# Patient Record
Sex: Female | Born: 1970 | Race: Black or African American | Hispanic: No | Marital: Single | Smoking: Never smoker
Health system: Southern US, Community
[De-identification: ages and names within clinical notes are randomized; demographics above are authoritative.]

## PROBLEM LIST (undated history)

## (undated) ENCOUNTER — Emergency Department (HOSPITAL_COMMUNITY): Payer: BLUE CROSS/BLUE SHIELD

## (undated) DIAGNOSIS — Z332 Encounter for elective termination of pregnancy: Secondary | ICD-10-CM

## (undated) DIAGNOSIS — R011 Cardiac murmur, unspecified: Secondary | ICD-10-CM

## (undated) DIAGNOSIS — D649 Anemia, unspecified: Secondary | ICD-10-CM

## (undated) DIAGNOSIS — Z9289 Personal history of other medical treatment: Secondary | ICD-10-CM

## (undated) HISTORY — PX: WISDOM TOOTH EXTRACTION: SHX21

## (undated) HISTORY — PX: TUBAL LIGATION: SHX77

---

## 1998-10-20 ENCOUNTER — Inpatient Hospital Stay (HOSPITAL_COMMUNITY): Admission: AD | Admit: 1998-10-20 | Discharge: 1998-10-20 | Payer: Self-pay | Admitting: Obstetrics

## 1998-10-30 ENCOUNTER — Other Ambulatory Visit: Admission: RE | Admit: 1998-10-30 | Discharge: 1998-10-30 | Payer: Self-pay | Admitting: Obstetrics

## 1998-10-30 ENCOUNTER — Ambulatory Visit (HOSPITAL_COMMUNITY): Admission: RE | Admit: 1998-10-30 | Discharge: 1998-10-30 | Payer: Self-pay | Admitting: Obstetrics

## 1998-11-14 ENCOUNTER — Inpatient Hospital Stay (HOSPITAL_COMMUNITY): Admission: AD | Admit: 1998-11-14 | Discharge: 1998-11-14 | Payer: Self-pay | Admitting: Obstetrics

## 1998-12-24 ENCOUNTER — Ambulatory Visit (HOSPITAL_COMMUNITY): Admission: RE | Admit: 1998-12-24 | Discharge: 1998-12-24 | Payer: Self-pay | Admitting: Obstetrics

## 1999-04-18 ENCOUNTER — Inpatient Hospital Stay (HOSPITAL_COMMUNITY): Admission: AD | Admit: 1999-04-18 | Discharge: 1999-04-21 | Payer: Self-pay | Admitting: Obstetrics

## 1999-05-09 ENCOUNTER — Inpatient Hospital Stay (HOSPITAL_COMMUNITY): Admission: AD | Admit: 1999-05-09 | Discharge: 1999-05-09 | Payer: Self-pay | Admitting: Obstetrics

## 1999-05-22 ENCOUNTER — Inpatient Hospital Stay (HOSPITAL_COMMUNITY): Admission: AD | Admit: 1999-05-22 | Discharge: 1999-05-24 | Payer: Self-pay | Admitting: Obstetrics

## 1999-05-25 ENCOUNTER — Encounter (HOSPITAL_COMMUNITY): Admission: RE | Admit: 1999-05-25 | Discharge: 1999-08-23 | Payer: Self-pay | Admitting: Obstetrics

## 1999-07-10 ENCOUNTER — Ambulatory Visit (HOSPITAL_COMMUNITY): Admission: RE | Admit: 1999-07-10 | Discharge: 1999-07-10 | Payer: Self-pay | Admitting: Obstetrics

## 2001-01-10 ENCOUNTER — Inpatient Hospital Stay (HOSPITAL_COMMUNITY): Admission: AD | Admit: 2001-01-10 | Discharge: 2001-01-10 | Payer: Self-pay | Admitting: Obstetrics

## 2002-07-14 ENCOUNTER — Encounter: Admission: RE | Admit: 2002-07-14 | Discharge: 2002-07-14 | Payer: Self-pay | Admitting: *Deleted

## 2004-06-27 ENCOUNTER — Emergency Department (HOSPITAL_COMMUNITY): Admission: EM | Admit: 2004-06-27 | Discharge: 2004-06-28 | Payer: Self-pay | Admitting: Emergency Medicine

## 2004-07-04 ENCOUNTER — Emergency Department (HOSPITAL_COMMUNITY): Admission: EM | Admit: 2004-07-04 | Discharge: 2004-07-04 | Payer: Self-pay | Admitting: Family Medicine

## 2004-09-04 ENCOUNTER — Ambulatory Visit: Payer: Self-pay | Admitting: Obstetrics and Gynecology

## 2006-08-21 ENCOUNTER — Emergency Department (HOSPITAL_COMMUNITY): Admission: EM | Admit: 2006-08-21 | Discharge: 2006-08-21 | Payer: Self-pay | Admitting: Family Medicine

## 2007-01-27 ENCOUNTER — Emergency Department (HOSPITAL_COMMUNITY): Admission: EM | Admit: 2007-01-27 | Discharge: 2007-01-27 | Payer: Self-pay | Admitting: Family Medicine

## 2007-04-25 ENCOUNTER — Other Ambulatory Visit: Admission: RE | Admit: 2007-04-25 | Discharge: 2007-04-25 | Payer: Self-pay | Admitting: Obstetrics and Gynecology

## 2008-12-22 ENCOUNTER — Emergency Department (HOSPITAL_COMMUNITY): Admission: EM | Admit: 2008-12-22 | Discharge: 2008-12-22 | Payer: Self-pay | Admitting: Emergency Medicine

## 2009-05-13 ENCOUNTER — Ambulatory Visit (HOSPITAL_COMMUNITY): Admission: RE | Admit: 2009-05-13 | Discharge: 2009-05-13 | Payer: Self-pay | Admitting: Obstetrics & Gynecology

## 2009-08-06 ENCOUNTER — Emergency Department (HOSPITAL_COMMUNITY): Admission: EM | Admit: 2009-08-06 | Discharge: 2009-08-06 | Payer: Self-pay | Admitting: Emergency Medicine

## 2009-08-09 ENCOUNTER — Emergency Department (HOSPITAL_COMMUNITY): Admission: EM | Admit: 2009-08-09 | Discharge: 2009-08-09 | Payer: Self-pay | Admitting: Emergency Medicine

## 2010-11-08 ENCOUNTER — Encounter: Payer: Self-pay | Admitting: *Deleted

## 2011-08-07 ENCOUNTER — Inpatient Hospital Stay (INDEPENDENT_AMBULATORY_CARE_PROVIDER_SITE_OTHER)
Admission: RE | Admit: 2011-08-07 | Discharge: 2011-08-07 | Disposition: A | Discharge status: Home / Self Care | Payer: Worker's Compensation | Source: Ambulatory Visit | Attending: Emergency Medicine | Admitting: Emergency Medicine

## 2011-08-07 DIAGNOSIS — S335XXA Sprain of ligaments of lumbar spine, initial encounter: Secondary | ICD-10-CM

## 2012-11-05 ENCOUNTER — Emergency Department (HOSPITAL_BASED_OUTPATIENT_CLINIC_OR_DEPARTMENT_OTHER): Payer: Self-pay

## 2012-11-05 ENCOUNTER — Emergency Department (HOSPITAL_BASED_OUTPATIENT_CLINIC_OR_DEPARTMENT_OTHER)
Admission: EM | Admit: 2012-11-05 | Discharge: 2012-11-05 | Disposition: A | Discharge status: Home / Self Care | Payer: Self-pay | Attending: Emergency Medicine | Admitting: Emergency Medicine

## 2012-11-05 ENCOUNTER — Encounter (HOSPITAL_BASED_OUTPATIENT_CLINIC_OR_DEPARTMENT_OTHER): Payer: Self-pay | Admitting: *Deleted

## 2012-11-05 DIAGNOSIS — R071 Chest pain on breathing: Secondary | ICD-10-CM | POA: Insufficient documentation

## 2012-11-05 DIAGNOSIS — R0789 Other chest pain: Secondary | ICD-10-CM

## 2012-11-05 DIAGNOSIS — R0602 Shortness of breath: Secondary | ICD-10-CM | POA: Insufficient documentation

## 2012-11-05 HISTORY — DX: Cardiac murmur, unspecified: R01.1

## 2012-11-05 MED ORDER — GI COCKTAIL ~~LOC~~
30.0000 mL | Freq: Once | ORAL | Status: AC
  Administered 2012-11-05: 30 mL via ORAL
  Filled 2012-11-05: qty 30

## 2012-11-05 MED ORDER — OMEPRAZOLE 20 MG PO CPDR: DELAYED_RELEASE_CAPSULE | ORAL | Status: DC

## 2012-11-05 MED ORDER — FAMOTIDINE 20 MG PO TABS: 20.0000 mg | ORAL_TABLET | Freq: Two times a day (BID) | ORAL | Status: DC

## 2012-11-05 MED ORDER — IBUPROFEN 600 MG PO TABS: 600.0000 mg | ORAL_TABLET | Freq: Four times a day (QID) | ORAL | Status: DC | PRN

## 2012-11-05 NOTE — ED Notes (Addendum)
Pt describes midsternal CP off and on x 2 weeks. "Indigestion" "Today not going away" Denies other s/s. EKG done at triage. Charge RN made aware.

## 2012-11-05 NOTE — ED Provider Notes (Signed)
History     CSN: [id number]  Arrival date & time 11/05/12  1440   First MD Initiated Contact with Patient 11/05/12 1517      Chief Complaint  Patient presents with  . Chest Pain    (Consider location/radiation/quality/duration/timing/severity/associated sxs/prior treatment) HPI Comments: Patient presents with a 1 week history of chest pain.  She states she had an uncomfortable sensation in her chest about 1 week ago.  She also developed a "stomach virus" which began shortly after her chest pain began.  She describes the pain as a burning pain directly under her sternum without radiation.  She states the pain is constant and is not worsened by eating or by certain types of foods.  No history of heart disease or indigestion.  Patient has tried TUMS for the pain which did not help. Currently, patient is not experiencing any N/V/D or fevers. Onset gradual, nothing has relieved symptoms. No risk factors for pulmonary embolism including recent travel or immobilizations, recent surgery, history of blood clots, estrogen use.  Patient is a 42 y.o. female presenting with chest pain. The history is provided by the patient.  Chest Pain Primary symptoms include shortness of breath (mild, with worse pain). Pertinent negatives for primary symptoms include no fever, no cough, no palpitations, no abdominal pain, no nausea and no vomiting.  Pertinent negatives for associated symptoms include no diaphoresis.     Past Medical History  Diagnosis Date  . Heart murmur     History reviewed. No pertinent past surgical history.  History reviewed. No pertinent family history.  History  Substance Use Topics  . Smoking status: Never Smoker   . Smokeless tobacco: Not on file  . Alcohol Use: No    OB History    Grav Para Term Preterm Abortions TAB SAB Ect Mult Living                  Review of Systems  Constitutional: Negative for fever and diaphoresis.  HENT: Negative for neck pain.   Eyes:  Negative for redness.  Respiratory: Positive for shortness of breath (mild, with worse pain). Negative for cough.   Cardiovascular: Positive for chest pain. Negative for palpitations and leg swelling.  Gastrointestinal: Negative for nausea, vomiting and abdominal pain.  Genitourinary: Negative for dysuria.  Musculoskeletal: Negative for back pain.  Skin: Negative for rash.  Neurological: Negative for syncope and light-headedness.    Allergies  Review of patient's allergies indicates no known allergies.  Home Medications  No current outpatient prescriptions on file.  BP 115/75  Pulse 96  Temp 97.6 F (36.4 C) (Oral)  Resp 20  Ht 5' (1.524 m)  Wt 150 lb (68.04 kg)  BMI 29.30 kg/m2  SpO2 99%  LMP 10/29/2012  Physical Exam  Nursing note and vitals reviewed. Constitutional: She appears well-developed and well-nourished.  HENT:  Head: Normocephalic and atraumatic.  Mouth/Throat: Mucous membranes are normal. Mucous membranes are not dry.  Eyes: Conjunctivae normal are normal.  Neck: Trachea normal and normal range of motion. Neck supple. Normal carotid pulses and no JVD present. No muscular tenderness present. Carotid bruit is not present. No tracheal deviation present.  Cardiovascular: Normal rate, regular rhythm, S1 normal, S2 normal, normal heart sounds and intact distal pulses.  Exam reveals no decreased pulses.   No murmur heard. Pulmonary/Chest: Effort normal and breath sounds normal. No respiratory distress. She has no wheezes. She exhibits tenderness.    Abdominal: Soft. Normal aorta and bowel sounds are normal. There is  no tenderness. There is no rebound and no guarding.  Musculoskeletal: Normal range of motion.  Neurological: She is alert.  Skin: Skin is warm and dry. She is not diaphoretic. No cyanosis. No pallor.  Psychiatric: She has a normal mood and affect.    ED Course  Procedures (including critical care time)  Labs Reviewed - No data to display Dg Chest 2  View  11/05/2012  *RADIOLOGY REPORT*  Clinical Data: Short of breath and chest pain  CHEST - 2 VIEW  Comparison: 08/09/2009  Findings: Heart size is normal.  Negative for heart failure. Negative for pneumonia or effusion.  Lungs are clear.  IMPRESSION: No acute abnormality.   Original Report Authenticated By: Janeece Riggers, M.D.      1. Chest wall pain     Patient seen and examined. Work-up initiated. GI cocktail ordered.   Vital signs reviewed and are as follows: Filed Vitals:   11/05/12 1447  BP: 115/75  Pulse: 96  Temp: 97.6 F (36.4 C)  Resp: 20   4:58 PM patient notes improvement of chest pain from 5/10-1/10 after GI cocktail.  Patient informed of x-ray results.  Patient was counseled to return with severe chest pain, especially if the pain is crushing or pressure-like and spreads to the arms, back, neck, or jaw, or if they have sweating, nausea, or shortness of breath with the pain. They were encouraged to call 911 with these symptoms.   They were also told to return if their chest pain gets worse and does not go away with rest, they have an attack of chest pain lasting longer than usual despite rest and treatment with the medications their caregiver has prescribed, if they wake from sleep with chest pain or shortness of breath, if they feel dizzy or faint, if they have chest pain not typical of their usual pain, or if they have any other emergent concerns regarding their health.  The patient verbalized understanding and agreed.   Abdominal exam unchanged. Soft and nontender.   Date: 11/05/2012  Rate: 76  Rhythm: normal sinus rhythm  QRS Axis: normal  Intervals: normal  ST/T Wave abnormalities: normal  Conduction Disutrbances:none  Narrative Interpretation:   Old EKG Reviewed: none available   MDM  Patient with reproducible chest pain to palpation, most suspicious for costochondritis. Chest x-ray was negative. EKG is normal. Patient did get relief with GI cocktail so  possible GERD component. No abdominal tenderness on exam. Do not suspect pulmonary embolism or coronary artery disease. Will treat her symptoms conservatively.         Renne Crigler, [place] 11/05/12 1710

## 2012-11-05 NOTE — ED Provider Notes (Signed)
Medical screening examination/treatment/procedure(s) were performed by non-physician practitioner and as supervising physician I was immediately available for consultation/collaboration.   Celene Kras, MD 11/05/12 [phone]

## 2014-04-17 ENCOUNTER — Encounter (HOSPITAL_COMMUNITY): Payer: Self-pay | Admitting: Emergency Medicine

## 2014-04-17 ENCOUNTER — Emergency Department (HOSPITAL_COMMUNITY)
Admission: EM | Admit: 2014-04-17 | Discharge: 2014-04-17 | Disposition: A | Discharge status: Home / Self Care | Payer: BC Managed Care – PPO | Attending: Emergency Medicine | Admitting: Emergency Medicine

## 2014-04-17 DIAGNOSIS — Z79899 Other long term (current) drug therapy: Secondary | ICD-10-CM | POA: Insufficient documentation

## 2014-04-17 DIAGNOSIS — N390 Urinary tract infection, site not specified: Secondary | ICD-10-CM

## 2014-04-17 DIAGNOSIS — R011 Cardiac murmur, unspecified: Secondary | ICD-10-CM | POA: Insufficient documentation

## 2014-04-17 LAB — URINALYSIS, ROUTINE W REFLEX MICROSCOPIC
BILIRUBIN URINE: NEGATIVE
Glucose, UA: NEGATIVE mg/dL
Ketones, ur: NEGATIVE mg/dL
NITRITE: NEGATIVE
Specific Gravity, Urine: 1.023 (ref 1.005–1.030)
UROBILINOGEN UA: 0.2 mg/dL (ref 0.0–1.0)
pH: 6 (ref 5.0–8.0)

## 2014-04-17 LAB — URINE MICROSCOPIC-ADD ON

## 2014-04-17 MED ORDER — NAPROXEN 250 MG PO TABS
250.0000 mg | ORAL_TABLET | Freq: Once | ORAL | Status: AC
  Administered 2014-04-17: 250 mg via ORAL
  Filled 2014-04-17: qty 1

## 2014-04-17 MED ORDER — PHENAZOPYRIDINE HCL 100 MG PO TABS
200.0000 mg | ORAL_TABLET | Freq: Once | ORAL | Status: AC
  Administered 2014-04-17: 200 mg via ORAL
  Filled 2014-04-17: qty 2

## 2014-04-17 MED ORDER — CEPHALEXIN 500 MG PO CAPS: 500.0000 mg | ORAL_CAPSULE | Freq: Two times a day (BID) | ORAL | Status: DC

## 2014-04-17 MED ORDER — PHENAZOPYRIDINE HCL 200 MG PO TABS: 200.0000 mg | ORAL_TABLET | Freq: Three times a day (TID) | ORAL | Status: DC | PRN

## 2014-04-17 MED ORDER — CEPHALEXIN 250 MG PO CAPS
500.0000 mg | ORAL_CAPSULE | Freq: Once | ORAL | Status: AC
  Administered 2014-04-17: 500 mg via ORAL
  Filled 2014-04-17: qty 2

## 2014-04-17 NOTE — Discharge Instructions (Signed)
Do not hesitate to return to the Emergency Department for any new, worsening or concerning symptoms.   If you do not have a primary care doctor you can establish one at the   [facility]: [place] [phone]  After you establish care. Let them know you were seen in the emergency room. They must obtain records for further management.    Urinary Tract Infection Urinary tract infections (UTIs) can develop anywhere along your urinary tract. Your urinary tract is your body's drainage system for removing wastes and extra water. Your urinary tract includes two kidneys, two ureters, a bladder, and a urethra. Your kidneys are a pair of bean-shaped organs. Each kidney is about the size of your fist. They are located below your ribs, one on each side of your spine. CAUSES Infections are caused by microbes, which are microscopic organisms, including fungi, viruses, and bacteria. These organisms are so small that they can only be seen through a microscope. Bacteria are the microbes that most commonly cause UTIs. SYMPTOMS  Symptoms of UTIs may vary by age and gender of the patient and by the location of the infection. Symptoms in young women typically include a frequent and intense urge to urinate and a painful, burning feeling in the bladder or urethra during urination. Older women and men are more likely to be tired, shaky, and weak and have muscle aches and abdominal pain. A fever may mean the infection is in your kidneys. Other symptoms of a kidney infection include pain in your back or sides below the ribs, nausea, and vomiting. DIAGNOSIS To diagnose a UTI, your caregiver will ask you about your symptoms. Your caregiver also will ask to provide a urine sample. The urine sample will be tested for bacteria and white blood cells. White blood cells are made by your body to help fight infection. TREATMENT  Typically, UTIs can be treated with medication. Because  most UTIs are caused by a bacterial infection, they usually can be treated with the use of antibiotics. The choice of antibiotic and length of treatment depend on your symptoms and the type of bacteria causing your infection. HOME CARE INSTRUCTIONS  If you were prescribed antibiotics, take them exactly as your caregiver instructs you. Finish the medication even if you feel better after you have only taken some of the medication.  Drink enough water and fluids to keep your urine clear or pale yellow.  Avoid caffeine, tea, and carbonated beverages. They tend to irritate your bladder.  Empty your bladder often. Avoid holding urine for long periods of time.  Empty your bladder before and after sexual intercourse.  After a bowel movement, women should cleanse from front to back. Use each tissue only once. SEEK MEDICAL CARE IF:   You have back pain.  You develop a fever.  Your symptoms do not begin to resolve within 3 days. SEEK IMMEDIATE MEDICAL CARE IF:   You have severe back pain or lower abdominal pain.  You develop chills.  You have nausea or vomiting.  You have continued burning or discomfort with urination. MAKE SURE YOU:   Understand these instructions.  Will watch your condition.  Will get help right away if you are not doing well or get worse. Document Released: 07/15/2005 Document Revised: 04/05/2012 Document Reviewed: 11/13/2011 [facility] Patient Information 2015 [place], [place]. This information is not intended to replace advice given to you by your health care provider. Make sure you discuss any questions you have with  your health care provider.

## 2014-04-17 NOTE — ED Provider Notes (Signed)
CSN: [id number]     Arrival date & time 04/17/14  2101 History  This chart was scribed for Monico Blitz, PA-C working with Tanna Furry, MD by Randa Evens, ED Scribe. This patient was seen in room TR08C/TR08C and the patient's care was started at 9:11 PM.  Chief Complaint  Patient presents with  . Dysuria   Patient is a 43 y.o. female presenting with dysuria. The history is provided by the patient. No language interpreter was used.  Dysuria Associated symptoms: no fever, no nausea, no vaginal discharge and no vomiting    HPI Comments: Linda Baldwin is a 43 y.o. female who presents to the Emergency Department complaining of dysuria onset 3 days prior. She states she thinks are due to an UTI. She denies vaginal discharge, flank pain, fever,chills, nausea, or vomiting.    Past Medical History  Diagnosis Date  . Heart murmur    History reviewed. No pertinent past surgical history. History reviewed. No pertinent family history. History  Substance Use Topics  . Smoking status: Never Smoker   . Smokeless tobacco: Not on file  . Alcohol Use: No   OB History   Grav Para Term Preterm Abortions TAB SAB Ect Mult Living                 Review of Systems  Constitutional: Negative for fever and chills.  Gastrointestinal: Negative for nausea and vomiting.  Genitourinary: Positive for dysuria. Negative for vaginal discharge.   A complete 10 system review of systems was obtained and all systems are negative except as noted in the HPI and PMH.   Allergies  Review of patient's allergies indicates no known allergies.  Home Medications   Prior to Admission medications   Medication Sig Start Date End Date Taking? Authorizing Provider  Calcium Carbonate-Vitamin D (CALTRATE 600+D PO) Take 1 tablet by mouth daily.   Yes Historical Provider, MD  Cyanocobalamin (B-12 PO) Take 1 tablet by mouth daily. Unknown strength-otc   Yes Historical Provider, MD  ferrous sulfate (IRON SUPPLEMENT) 325  (65 FE) MG tablet Take 325 mg by mouth daily.   Yes Historical Provider, MD  Multiple Vitamin (MULTIVITAMIN) tablet Take 1 tablet by mouth daily.   Yes Historical Provider, MD  cephALEXin (KEFLEX) 500 MG capsule Take 1 capsule (500 mg total) by mouth 2 (two) times daily. 04/17/14   Nicole Pisciotta, PA-C  phenazopyridine (PYRIDIUM) 200 MG tablet Take 1 tablet (200 mg total) by mouth 3 (three) times daily as needed for pain. 04/17/14   Nicole Pisciotta, PA-C   Triage Vitals: BP 111/75  Pulse 84  Temp(Src) 98.7 F (37.1 C) (Oral)  Resp 18  SpO2 99%  Physical Exam  Nursing note and vitals reviewed. Constitutional: She is oriented to person, place, and time. She appears well-developed and well-nourished. No distress.  HENT:  Head: Normocephalic and atraumatic.  Eyes: Conjunctivae and EOM are normal.  Neck: Neck supple. No tracheal deviation present.  Cardiovascular: Normal rate, regular rhythm and intact distal pulses.   Pulmonary/Chest: Effort normal and breath sounds normal. No respiratory distress.  Abdominal: Soft. Bowel sounds are normal. She exhibits no distension. There is no tenderness. There is no rebound and no guarding.  Genitourinary:  No CVA tenderness to palpation bilaterally  Musculoskeletal: Normal range of motion.  Neurological: She is alert and oriented to person, place, and time.  Skin: Skin is warm and dry.  Psychiatric: She has a normal mood and affect. Her behavior is normal.  ED Course  Procedures (including critical care time) DIAGNOSTIC STUDIES: Oxygen Saturation is 99% on RA, normal by my interpretation.    COORDINATION OF CARE:    Labs Review Labs Reviewed  URINALYSIS, ROUTINE W REFLEX MICROSCOPIC - Abnormal; Notable for the following:    APPearance CLOUDY (*)    Hgb urine dipstick TRACE (*)    Protein, ur >300 (*)    Leukocytes, UA MODERATE (*)    All other components within normal limits  URINE MICROSCOPIC-ADD ON - Abnormal; Notable for the  following:    Squamous Epithelial / LPF FEW (*)    Bacteria, UA MANY (*)    All other components within normal limits  URINE CULTURE    Imaging Review No results found.   EKG Interpretation None      MDM   Final diagnoses:  UTI (lower urinary tract infection)   Filed Vitals:   04/17/14 2107 04/17/14 2257  BP: 111/75 127/87  Pulse: 84 74  Temp: 98.7 F (37.1 C) 97.4 F (36.3 C)  TempSrc: Oral Oral  Resp: 18 18  SpO2: 99% 100%    Medications  naproxen (NAPROSYN) tablet 250 mg (250 mg Oral Given 04/17/14 2203)  cephALEXin (KEFLEX) capsule 500 mg (500 mg Oral Given 04/17/14 2248)  phenazopyridine (PYRIDIUM) tablet 200 mg (200 mg Oral Given 04/17/14 2247)    Linda Baldwin is a 43 y.o. female presenting with urinary tract infection. No systemic signs of infection, no CVA tenderness palpation bilaterally chart review does not show any prior urine culture. Empirically treated with Keflex.  Evaluation does not show pathology that would require ongoing emergent intervention or inpatient treatment. Pt is hemodynamically stable and mentating appropriately. Discussed findings and plan with patient/guardian, who agrees with care plan. All questions answered. Return precautions discussed and outpatient follow up given.   Discharge Medication List as of 04/17/2014 10:48 PM    START taking these medications   Details  cephALEXin (KEFLEX) 500 MG capsule Take 1 capsule (500 mg total) by mouth 2 (two) times daily., Starting 04/17/2014, Until Discontinued, Print    phenazopyridine (PYRIDIUM) 200 MG tablet Take 1 tablet (200 mg total) by mouth 3 (three) times daily as needed for pain., Starting 04/17/2014, Until Discontinued, Print              I personally performed the services described in this documentation, which was scribed in my presence. The recorded information has been reviewed and is accurate.     Monico Blitz, PA-C 04/18/14 [phone]

## 2014-04-17 NOTE — ED Notes (Signed)
Pt informed, urine need for UA.

## 2014-04-17 NOTE — ED Notes (Signed)
Present with dysuria began 3 days ago. Denies vaginal discharge and blood in urine.

## 2014-04-19 LAB — URINE CULTURE

## 2014-04-20 ENCOUNTER — Telehealth (HOSPITAL_COMMUNITY): Payer: Self-pay

## 2014-04-20 NOTE — ED Notes (Signed)
Post ED Visit - Positive Culture Follow-up: Successful Patient Follow-Up  Culture assessed and recommendations reviewed by: []  Wes [place], Pharm.D., BCPS []  Heide Guile, Pharm.D., BCPS []  Alycia Rossetti, Pharm.D., BCPS [x]  [place], Pharm.D., BCPS, AAHIVP []  Legrand Como, Pharm.D., BCPS, AAHIVP  Positive urine culture  []  Patient discharged without antimicrobial prescription and treatment is now indicated [x]  Organism is resistant to prescribed ED discharge antimicrobial []  Patient with positive blood cultures  Changes discussed with ED provider: Clayton Bibles PA New antibiotic prescription  Stop Keflex and start Septra DS take 1 bid x 3 days Called to unknown at this time  Contacted patient, date 04/20/14, time 1007   Marye Round C 04/20/2014, 10:06 AM

## 2014-04-20 NOTE — Progress Notes (Signed)
ED Antimicrobial Stewardship Positive Culture Follow Up   Linda Baldwin is an 43 y.o. female who presented to [facility] on 04/17/2014 with a chief complaint of  Chief Complaint  Patient presents with  . Dysuria    Recent Results (from the past 720 hour(s))  URINE CULTURE     Status: None   Collection Time    04/17/14  9:39 PM      Result Value Ref Range Status   Specimen Description URINE, CLEAN CATCH   Final   Special Requests NONE   Final   Culture  Setup Time     Final   Value: 04/17/2014 23:05     Performed at [place]     Final   Value: >=100,000 COLONIES/ML     Performed at [facility]   Culture     Final   Value: STAPHYLOCOCCUS SPECIES (COAGULASE NEGATIVE)     Note: RIFAMPIN AND GENTAMICIN SHOULD NOT BE USED AS SINGLE DRUGS FOR TREATMENT OF STAPH INFECTIONS.     Performed at [facility]   Report Status 04/19/2014 FINAL   Final   Organism ID, Bacteria STAPHYLOCOCCUS SPECIES (COAGULASE NEGATIVE)   Final    [x]  Treated with keflex, organism resistant to prescribed antimicrobial 43 yo who came in with 3 days of dysuria. She was dc home on keflex for UTI. Culture now back with coag neg staph that is resistant to beta-lactams. We'll treat with septra.  New antibiotic prescription:   Septra DS 1 PO BID x3 days  ED Provider: Clayton Bibles, PA   Onnie Boer, PharmD Pager: [phone] Infectious Diseases Pharmacist Phone# [phone]

## 2014-04-24 NOTE — ED Provider Notes (Signed)
Medical screening examination/treatment/procedure(s) were performed by non-physician practitioner and as supervising physician I was immediately available for consultation/collaboration.   EKG Interpretation None        Tanna Furry, MD 04/24/14 [phone]

## 2018-01-16 ENCOUNTER — Inpatient Hospital Stay (HOSPITAL_COMMUNITY): Payer: BLUE CROSS/BLUE SHIELD

## 2018-01-16 ENCOUNTER — Inpatient Hospital Stay (HOSPITAL_BASED_OUTPATIENT_CLINIC_OR_DEPARTMENT_OTHER)
Admission: EM | Admit: 2018-01-16 | Discharge: 2018-01-19 | DRG: 761 | Disposition: A | Discharge status: Home / Self Care | Payer: BLUE CROSS/BLUE SHIELD | Attending: Internal Medicine | Admitting: Internal Medicine

## 2018-01-16 ENCOUNTER — Encounter (HOSPITAL_BASED_OUTPATIENT_CLINIC_OR_DEPARTMENT_OTHER): Payer: Self-pay | Admitting: Emergency Medicine

## 2018-01-16 ENCOUNTER — Other Ambulatory Visit: Payer: Self-pay

## 2018-01-16 ENCOUNTER — Emergency Department (HOSPITAL_BASED_OUTPATIENT_CLINIC_OR_DEPARTMENT_OTHER): Payer: BLUE CROSS/BLUE SHIELD

## 2018-01-16 DIAGNOSIS — Z79899 Other long term (current) drug therapy: Secondary | ICD-10-CM

## 2018-01-16 DIAGNOSIS — Z792 Long term (current) use of antibiotics: Secondary | ICD-10-CM

## 2018-01-16 DIAGNOSIS — N920 Excessive and frequent menstruation with regular cycle: Secondary | ICD-10-CM | POA: Diagnosis present

## 2018-01-16 DIAGNOSIS — D649 Anemia, unspecified: Secondary | ICD-10-CM | POA: Diagnosis present

## 2018-01-16 DIAGNOSIS — D500 Iron deficiency anemia secondary to blood loss (chronic): Secondary | ICD-10-CM | POA: Diagnosis present

## 2018-01-16 DIAGNOSIS — K210 Gastro-esophageal reflux disease with esophagitis: Secondary | ICD-10-CM | POA: Diagnosis present

## 2018-01-16 DIAGNOSIS — R112 Nausea with vomiting, unspecified: Secondary | ICD-10-CM | POA: Diagnosis present

## 2018-01-16 DIAGNOSIS — E860 Dehydration: Secondary | ICD-10-CM | POA: Diagnosis present

## 2018-01-16 DIAGNOSIS — R509 Fever, unspecified: Secondary | ICD-10-CM | POA: Diagnosis present

## 2018-01-16 DIAGNOSIS — E669 Obesity, unspecified: Secondary | ICD-10-CM | POA: Diagnosis present

## 2018-01-16 DIAGNOSIS — R58 Hemorrhage, not elsewhere classified: Secondary | ICD-10-CM | POA: Diagnosis not present

## 2018-01-16 DIAGNOSIS — R197 Diarrhea, unspecified: Secondary | ICD-10-CM | POA: Diagnosis present

## 2018-01-16 DIAGNOSIS — D72829 Elevated white blood cell count, unspecified: Secondary | ICD-10-CM | POA: Diagnosis present

## 2018-01-16 DIAGNOSIS — E876 Hypokalemia: Secondary | ICD-10-CM | POA: Diagnosis present

## 2018-01-16 DIAGNOSIS — Z6831 Body mass index (BMI) 31.0-31.9, adult: Secondary | ICD-10-CM

## 2018-01-16 DIAGNOSIS — R5081 Fever presenting with conditions classified elsewhere: Secondary | ICD-10-CM | POA: Diagnosis not present

## 2018-01-16 DIAGNOSIS — D259 Leiomyoma of uterus, unspecified: Principal | ICD-10-CM | POA: Diagnosis present

## 2018-01-16 LAB — INFLUENZA PANEL BY PCR (TYPE A & B)
Influenza A By PCR: NEGATIVE
Influenza B By PCR: NEGATIVE

## 2018-01-16 LAB — CBC
HEMATOCRIT: 18.7 % — AB (ref 36.0–46.0)
Hemoglobin: 5.6 g/dL — CL (ref 12.0–15.0)
MCH: 20 pg — ABNORMAL LOW (ref 26.0–34.0)
MCHC: 29.9 g/dL — ABNORMAL LOW (ref 30.0–36.0)
MCV: 66.8 fL — ABNORMAL LOW (ref 78.0–100.0)
Platelets: 604 10*3/uL — ABNORMAL HIGH (ref 150–400)
RBC: 2.8 MIL/uL — AB (ref 3.87–5.11)
RDW: 28 % — AB (ref 11.5–15.5)
WBC: 10.6 10*3/uL — AB (ref 4.0–10.5)

## 2018-01-16 LAB — COMPREHENSIVE METABOLIC PANEL
ALT: 8 U/L — ABNORMAL LOW (ref 14–54)
ANION GAP: 11 (ref 5–15)
AST: 20 U/L (ref 15–41)
Albumin: 4.1 g/dL (ref 3.5–5.0)
Alkaline Phosphatase: 50 U/L (ref 38–126)
BILIRUBIN TOTAL: 0.8 mg/dL (ref 0.3–1.2)
BUN: 11 mg/dL (ref 6–20)
CO2: 21 mmol/L — ABNORMAL LOW (ref 22–32)
Calcium: 8.3 mg/dL — ABNORMAL LOW (ref 8.9–10.3)
Chloride: 105 mmol/L (ref 101–111)
Creatinine, Ser: 0.62 mg/dL (ref 0.44–1.00)
GFR calc non Af Amer: >60 mL/min (ref >60)
Glucose, Bld: 112 mg/dL — ABNORMAL HIGH (ref 65–99)
POTASSIUM: 3.3 mmol/L — AB (ref 3.5–5.1)
Sodium: 137 mmol/L (ref 135–145)
TOTAL PROTEIN: 7.7 g/dL (ref 6.5–8.1)

## 2018-01-16 LAB — URINALYSIS, ROUTINE W REFLEX MICROSCOPIC
Bilirubin Urine: NEGATIVE
Glucose, UA: NEGATIVE mg/dL
Hgb urine dipstick: NEGATIVE
KETONES UR: NEGATIVE mg/dL
LEUKOCYTES UA: NEGATIVE
NITRITE: NEGATIVE
PH: 5.5 (ref 5.0–8.0)
Protein, ur: NEGATIVE mg/dL

## 2018-01-16 LAB — PREPARE RBC (CROSSMATCH)

## 2018-01-16 LAB — FERRITIN: FERRITIN: 3 ng/mL — AB (ref 11–307)

## 2018-01-16 LAB — PREGNANCY, URINE: Preg Test, Ur: NEGATIVE

## 2018-01-16 LAB — VITAMIN B12: Vitamin B-12: 310 pg/mL (ref 180–914)

## 2018-01-16 LAB — HEMOGLOBIN AND HEMATOCRIT, BLOOD
HEMATOCRIT: 18.2 % — AB (ref 36.0–46.0)
HEMOGLOBIN: 5.4 g/dL — AB (ref 12.0–15.0)

## 2018-01-16 LAB — LIPASE, BLOOD: Lipase: 26 U/L (ref 11–51)

## 2018-01-16 LAB — RETICULOCYTES
RBC.: 2.59 MIL/uL — ABNORMAL LOW (ref 3.87–5.11)
RETIC CT PCT: 1.5 % (ref 0.4–3.1)
Retic Count, Absolute: 38.9 10*3/uL (ref 19.0–186.0)

## 2018-01-16 LAB — IRON AND TIBC
IRON: 17 ug/dL — AB (ref 28–170)
SATURATION RATIOS: 4 % — AB (ref 10.4–31.8)
TIBC: 468 ug/dL — AB (ref 250–450)
UIBC: 451 ug/dL

## 2018-01-16 LAB — OCCULT BLOOD X 1 CARD TO LAB, STOOL: Fecal Occult Bld: NEGATIVE

## 2018-01-16 LAB — FOLATE: Folate: 26 ng/mL (ref >5.9)

## 2018-01-16 MED ORDER — ACETAMINOPHEN 325 MG PO TABS
650.0000 mg | ORAL_TABLET | ORAL | Status: DC | PRN
  Administered 2018-01-16: 650 mg via ORAL
  Filled 2018-01-16: qty 2

## 2018-01-16 MED ORDER — SODIUM CHLORIDE 0.9 % IV SOLN
Freq: Once | INTRAVENOUS | Status: AC
  Administered 2018-01-17: 05:00:00 via INTRAVENOUS

## 2018-01-16 MED ORDER — SODIUM CHLORIDE 0.9 % IV SOLN
INTRAVENOUS | Status: DC
  Administered 2018-01-16: 20:00:00 via INTRAVENOUS

## 2018-01-16 MED ORDER — ACETAMINOPHEN 325 MG PO TABS
650.0000 mg | ORAL_TABLET | Freq: Once | ORAL | Status: AC
  Administered 2018-01-16: 650 mg via ORAL
  Filled 2018-01-16: qty 2

## 2018-01-16 MED ORDER — SODIUM CHLORIDE 0.9 % IV SOLN
25.0000 mg | Freq: Once | INTRAVENOUS | Status: AC
  Administered 2018-01-16: 25 mg via INTRAVENOUS
  Filled 2018-01-16: qty 0.5

## 2018-01-16 MED ORDER — ONDANSETRON HCL 4 MG/2ML IJ SOLN
4.0000 mg | Freq: Once | INTRAMUSCULAR | Status: AC | PRN
  Administered 2018-01-16: 4 mg via INTRAVENOUS
  Filled 2018-01-16: qty 2

## 2018-01-16 MED ORDER — SODIUM CHLORIDE 0.9 % IV BOLUS
1000.0000 mL | Freq: Once | INTRAVENOUS | Status: AC
  Administered 2018-01-16: 1000 mL via INTRAVENOUS

## 2018-01-16 MED ORDER — POTASSIUM CHLORIDE CRYS ER 20 MEQ PO TBCR
40.0000 meq | EXTENDED_RELEASE_TABLET | Freq: Two times a day (BID) | ORAL | Status: AC
  Administered 2018-01-16 – 2018-01-17 (×2): 40 meq via ORAL
  Filled 2018-01-16 (×2): qty 2

## 2018-01-16 MED ORDER — SODIUM CHLORIDE 0.9 % IV SOLN
500.0000 mg | Freq: Once | INTRAVENOUS | Status: AC
  Administered 2018-01-17: 500 mg via INTRAVENOUS
  Filled 2018-01-16: qty 10

## 2018-01-16 MED ORDER — ONDANSETRON HCL 4 MG/2ML IJ SOLN: 4.0000 mg | Freq: Four times a day (QID) | INTRAMUSCULAR | Status: DC | PRN

## 2018-01-16 MED ORDER — IOPAMIDOL (ISOVUE-300) INJECTION 61%
100.0000 mL | Freq: Once | INTRAVENOUS | Status: AC | PRN
  Administered 2018-01-16: 100 mL via INTRAVENOUS

## 2018-01-16 MED ORDER — PANTOPRAZOLE SODIUM 40 MG IV SOLR
40.0000 mg | INTRAVENOUS | Status: DC
  Administered 2018-01-16 – 2018-01-18 (×3): 40 mg via INTRAVENOUS
  Filled 2018-01-16 (×3): qty 40

## 2018-01-16 NOTE — ED Notes (Addendum)
Pt placed on 2L O2 to help with tachypnea. Respirations remain unlabored.

## 2018-01-16 NOTE — Progress Notes (Signed)
CRITICAL VALUE ALERT  Critical Value:  hgb 5.4  Date & Time Notied:  01/16/18 2217   Provider Notified: Silas [place], NP  Orders Received/Actions taken: no new orders at this time, awaiting for blood to become available in lab

## 2018-01-16 NOTE — ED Notes (Signed)
Date and time results received: 01/16/18 1233(use smartphrase ".now" to insert current time)  Test: Hgb Critical Value: 5.6 Name of Provider Notified: PA Quentin Cornwall Orders Received? Or Actions Taken?: no orders given

## 2018-01-16 NOTE — ED Provider Notes (Addendum)
[facility] Provider Note   CSN: [id number] Arrival date & time: 01/16/18  1122     History   Chief Complaint Chief Complaint  Patient presents with  . Emesis    HPI Linda [facility] is a 47 y.o. female without significant past medical history, presenting to the ED with acute onset of nausea, vomiting, diarrhea that began yesterday afternoon.  Patient states she has had about 7 episodes of emesis since yesterday, that is nonbloody and nonbilious.  She states she also had a few episodes of nonbloody diarrhea.  She reports mild epigastric abdominal soreness, which she attributes to vomiting.  She states she feels tired and weak, this began after she began vomiting.  She denies associated fever, urinary symptoms, vaginal bleeding or discharge.  She states she had some ginger ale this morning, and has not had vomiting since that time.  LMP began 01/05/2018, and lasted just over a week.  The history is provided by the patient.    Past Medical History:  Diagnosis Date  . Heart murmur     Patient Active Problem List   Diagnosis Date Noted  . Symptomatic anemia 01/16/2018    History reviewed. No pertinent surgical history.   OB History   None      Home Medications    Prior to Admission medications   Medication Sig Start Date End Date Taking? Authorizing Provider  Calcium Carbonate-Vitamin D (CALTRATE 600+D PO) Take 1 tablet by mouth daily.    [provider]  cephALEXin (KEFLEX) 500 MG capsule Take 1 capsule (500 mg total) by mouth 2 (two) times daily. 04/17/14   Pisciotta, Elmyra Ricks, PA-C  Cyanocobalamin (B-12 PO) Take 1 tablet by mouth daily. Unknown strength-otc    [provider]  ferrous sulfate (IRON SUPPLEMENT) 325 (65 FE) MG tablet Take 325 mg by mouth daily.    [provider]  Multiple Vitamin (MULTIVITAMIN) tablet Take 1 tablet by mouth daily.    [provider]  phenazopyridine (PYRIDIUM) 200 MG tablet  Take 1 tablet (200 mg total) by mouth 3 (three) times daily as needed for pain. 04/17/14   Pisciotta, Charna Elizabeth    Family History History reviewed. No pertinent family history.  Social History Social History   Tobacco Use  . Smoking status: Never Smoker  . Smokeless tobacco: Never Used  Substance Use Topics  . Alcohol use: No  . Drug use: No     Allergies   Patient has no known allergies.   Review of Systems Review of Systems  Constitutional: Positive for chills. Negative for fever.  Gastrointestinal: Positive for abdominal pain, diarrhea, nausea and vomiting. Negative for blood in stool.  Genitourinary: Negative for dysuria, frequency, hematuria, vaginal bleeding and vaginal discharge.  Hematological: Does not bruise/bleed easily.  All other systems reviewed and are negative.    Physical Exam Updated Vital Signs BP 106/63   Pulse (!) 107   Temp 100.2 F (37.9 C) (Oral)   Resp (!) 33   Ht 5' (1.524 m)   Wt 68 kg (150 lb)   LMP 01/16/2018   SpO2 98%   BMI 29.29 kg/m   Physical Exam  Constitutional: She appears well-developed and well-nourished. No distress.  Appears mildly pale.   HENT:  Head: Normocephalic and atraumatic.  Mouth/Throat: Oropharynx is clear and moist.  Eyes: Conjunctivae are normal.  Cardiovascular: Normal rate, regular rhythm and intact distal pulses.  Pulmonary/Chest: Effort normal and breath sounds normal. No respiratory distress.  Abdominal: Soft. Bowel sounds are normal. She exhibits distension (mild) and mass (firm large mass in mid lower abdomen from umbilicus down to pubis.). There is tenderness (RLQ, mild) in the right lower quadrant and suprapubic area. There is no rebound and no guarding.  Neurological: She is alert.  Skin: Skin is warm.  Psychiatric: She has a normal mood and affect. Her behavior is normal.  Nursing note and vitals reviewed.    ED Treatments / Results  Labs (all labs ordered are listed, but only abnormal  results are displayed) Labs Reviewed  COMPREHENSIVE METABOLIC PANEL - Abnormal; Notable for the following components:      Result Value   Potassium 3.3 (*)    CO2 21 (*)    Glucose, Bld 112 (*)    Calcium 8.3 (*)    ALT 8 (*)    All other components within normal limits  CBC - Abnormal; Notable for the following components:   WBC 10.6 (*)    RBC 2.80 (*)    Hemoglobin 5.6 (*)    HCT 18.7 (*)    MCV 66.8 (*)    MCH 20.0 (*)    MCHC 29.9 (*)    RDW 28.0 (*)    Platelets 604 (*)    All other components within normal limits  URINALYSIS, ROUTINE W REFLEX MICROSCOPIC - Abnormal; Notable for the following components:   Specific Gravity, Urine >1.030 (*)    All other components within normal limits  LIPASE, BLOOD  PREGNANCY, URINE  OCCULT BLOOD X 1 CARD TO LAB, STOOL  INFLUENZA PANEL BY PCR (TYPE A & B)  VITAMIN B12  FOLATE  IRON AND TIBC  FERRITIN  RETICULOCYTES    EKG EKG Interpretation  Date/Time:  Sunday January 16 2018 12:38:42 EDT Ventricular Rate:  99 PR Interval:    QRS Duration: 91 QT Interval:  347 QTC Calculation: 446 R Axis:   6 Text Interpretation:  Sinus rhythm Borderline T abnormalities, anterior leads When compard to prior, no significant changes seen.  No STEMI Confirmed by Antony Blackbird [id number] on 01/16/2018 12:41:51 PM   Radiology Ct Abdomen Pelvis W Contrast  Result Date: 01/16/2018 CLINICAL DATA:  Abdominal pain.  Anemia. EXAM: CT ABDOMEN AND PELVIS WITH CONTRAST TECHNIQUE: Multidetector CT imaging of the abdomen and pelvis was performed using the standard protocol following bolus administration of intravenous contrast. CONTRAST:  129mL ISOVUE-300 IOPAMIDOL (ISOVUE-300) INJECTION 61% COMPARISON:  None FINDINGS: Lower chest: No acute abnormality. Hepatobiliary: No focal liver abnormality is seen. No gallstones, gallbladder wall thickening, or biliary dilatation. Pancreas: Unremarkable. No pancreatic ductal dilatation or surrounding inflammatory changes.  Spleen: Normal in size without focal abnormality. Adrenals/Urinary Tract: The adrenal glands appear normal. Unremarkable appearance of the kidneys. No mass or hydronephrosis. The urinary bladder appears normal. Stomach/Bowel: The stomach is normal. The small bowel loops have a normal course and caliber. No evidence for obstruction. The appendix is visualized and appears normal. No pathologic dilatation of the colon. Vascular/Lymphatic: Normal appearance of the abdominal aorta. No enlarged retroperitoneal or mesenteric adenopathy. No enlarged pelvic or inguinal lymph nodes. Reproductive: Markedly enlarged fibroid uterus is identified. The uterus measures 10.8 x 14.1 x 18.9 cm (volume = 1510 cm^3). Cyst in the left ovary measures 3.7 cm. Other: No free fluid or fluid collections. Musculoskeletal: Degenerative disc disease noted within the lower lumbar spine. IMPRESSION: 1. No acute findings within the abdomen or pelvis. 2. Markedly enlarged fibroid uterus with a volume of approximately 1500 cc. 3. Left ovary cyst.  Electronically Signed   By: Kerby Moors M.D.   On: 01/16/2018 13:45    Procedures Procedures (including critical care time) CRITICAL CARE Performed by: [place] N Andjela Wickes   Total critical care time: 40 minutes  Critical care time was exclusive of separately billable procedures and treating other patients.  Critical care was necessary to treat or prevent imminent or life-threatening deterioration.  Critical care was time spent personally by me on the following activities: development of treatment plan with patient and/or surrogate as well as nursing, discussions with consultants, evaluation of patient's response to treatment, examination of patient, obtaining history from patient or surrogate, ordering and performing treatments and interventions, ordering and review of laboratory studies, ordering and review of radiographic studies, pulse oximetry and re-evaluation of patient's  condition.   Medications Ordered in ED Medications  ondansetron (ZOFRAN) injection 4 mg (4 mg Intravenous Given 01/16/18 1210)  sodium chloride 0.9 % bolus 1,000 mL (0 mLs Intravenous Stopped 01/16/18 1418)  iopamidol (ISOVUE-300) 61 % injection 100 mL (100 mLs Intravenous Contrast Given 01/16/18 1304)  acetaminophen (TYLENOL) tablet 650 mg (650 mg Oral Given 01/16/18 1548)     Initial Impression / Assessment and Plan / ED Course  I have reviewed the triage vital signs and the nursing notes.  Pertinent labs & imaging results that were available during my care of the patient were reviewed by me and considered in my medical decision making (see chart for details).  Clinical Course as of Jan 17 1643  Sun Jan 16, 2018  1245 Hgb 5.6. Pt states she had longer period than normal this month, though is not actively bleeding.Reports hx of iron deficiency which she was told at blood bank. Denies any sx prior to onset of N/V yesterday.    [JR]  5916 Dr. Karleen Hampshire, accepting admission to [facility].    [JR]    Clinical Course User Index [JR] Virdell Hoiland, [place] N, PA-C    Patient presenting to the ED for acute onset of nausea, vomiting, diarrhea that began yesterday. On initial exam, pt is afebrile with stable vital signs. Abd is soft, with large firm mass in lower abdomen. Mild RLQ tenderness, no peritoneal signs.  Labs with marked anema with hgb 5.6, HCT 18.7. CMP unremarkable. Lipase neg. CT with large fibroid uterus, otherwise no acute pathology or source of bleed. Hemoccult neg. U/A neg. Urine preg neg.  Pt aware of urine fibroids.  She states her period was a little bit longer than normal this month, however without significant bleeding.  She reports known history of iron deficiency anemia, however no other known hx of anemia.  Discussed recommendation for admission in the setting of dehydration and symptomatic anemia.  Patient has been given 1 L of IV fluids, however unable to provide more fluids given  patient's marked anemia.  Dr. Karleen Hampshire with [facility] accepting admission to [facility].  On reevaluation, patient developed fever 102.7 with mild tachycardia.  Tylenol given, with improvement to 100.2 F.  Patient discussed with Dr. Sherry Ruffing who agrees with care plan.  Final Clinical Impressions(s) / ED Diagnoses   Final diagnoses:  Nausea vomiting and diarrhea  Symptomatic anemia    ED Discharge Orders    None       Jailene Cupit, [place] N, PA-C 01/16/18 1739    Tegeler, Gwenyth Allegra, MD 01/16/18 1745    Ruie Sendejo, [place] N, PA-C 01/31/18 1022    Tegeler, Gwenyth Allegra, MD 01/31/18 1031

## 2018-01-16 NOTE — ED Notes (Signed)
Patient transported to CT 

## 2018-01-16 NOTE — ED Triage Notes (Signed)
Patient has been vomiting since yesterday at 3 pm - patient is weak and tired

## 2018-01-16 NOTE — H&P (Addendum)
History and Physical    Linda [facility] ZDG:[id number] DOB: [birth year] DOA: 01/16/2018  PCP: Patient, No Pcp Per  Patient coming from: Home/ Transfer from [facility]  I have personally briefly reviewed patient's old medical records in [place]  Chief Complaint: nausea, vomiting and abdominal pain since yesterday.   HPI: Linda Baldwin is a 47 y.o. female with medical history significant of anemia, presented to [facility] ED for  Nausea, vomiting and abdominal pain since yesterday. Pt reports she ate chicken outside and sicne then has not been feeling good. Sister at bedside, reports that she has been anemic for a long time. She denies any fevers or chills at home. Abdominal pain is in the epigastric area, non radiating, associated with nausea, vomiting and diarrhea. Pt 's sister reports green watery stool. No sob, chest pain, URI symptoms, no sick contacts or similar complaints in the past.  On arrival to ED, she was found to be febrile, slightly tachycardic. Her hemoglobin was found to be around 5, stool for guiac negative. CT abd shows a fibroid uterus with a volume of 1500 ml.  She was referred to medical service for evaluation of symptomatic anemia.   She further denies dysuria, weakness of the lower extremities, headache, hematemesis, or hematochezia, syncope, dizziness.   Review of Systems: As per HPI otherwise 10 point review of systems negative.    Past Medical History:  Diagnosis Date  . Heart murmur     History reviewed. No pertinent surgical history.   reports that she has never smoked. She has never used smokeless tobacco. She reports that she does not drink alcohol or use drugs.  No Known Allergies  History reviewed. No pertinent family history.  Prior to Admission medications   Medication Sig Start Date End Date Taking? Authorizing Provider  Calcium Carbonate-Vitamin D (CALTRATE 600+D PO) Take 1 tablet by mouth daily.    [provider]  cephALEXin (KEFLEX) 500  MG capsule Take 1 capsule (500 mg total) by mouth 2 (two) times daily. 04/17/14   Pisciotta, Elmyra Ricks, PA-C  Cyanocobalamin (B-12 PO) Take 1 tablet by mouth daily. Unknown strength-otc    [provider]  ferrous sulfate (IRON SUPPLEMENT) 325 (65 FE) MG tablet Take 325 mg by mouth daily.    [provider]  Multiple Vitamin (MULTIVITAMIN) tablet Take 1 tablet by mouth daily.    [provider]  phenazopyridine (PYRIDIUM) 200 MG tablet Take 1 tablet (200 mg total) by mouth 3 (three) times daily as needed for pain. 04/17/14   Monico Blitz, PA-C    Physical Exam: Vitals:   01/16/18 1525 01/16/18 1530 01/16/18 1600 01/16/18 1630  BP: 108/61 104/60 108/60 106/63  Pulse: (!) 110 (!) 107 (!) 109 (!) 107  Resp: (!) 37 (!) 36 (!) 28 (!) 33  Temp: (!) 102.7 F (39.3 C)   100.2 F (37.9 C)  TempSrc: Oral   Oral  SpO2: 92% 95% 96% 98%  Weight:      Height:        Constitutional: NAD, calm, comfortable Vitals:   01/16/18 1525 01/16/18 1530 01/16/18 1600 01/16/18 1630  BP: 108/61 104/60 108/60 106/63  Pulse: (!) 110 (!) 107 (!) 109 (!) 107  Resp: (!) 37 (!) 36 (!) 28 (!) 33  Temp: (!) 102.7 F (39.3 C)   100.2 F (37.9 C)  TempSrc: Oral   Oral  SpO2: 92% 95% 96% 98%  Weight:      Height:  Eyes: PERRL, lids and conjunctivae normal ENMT: Mucous membranes are moist. Posterior pharynx clear of any exudate or lesions.Normal dentition.  Neck: normal, supple, no masses, no thyromegaly Respiratory: clear to auscultation bilaterally, no wheezing, no crackles. Normal respiratory effort. No accessory muscle use.  Cardiovascular: Regular rate and rhythm, no murmurs / rubs / gallops. No extremity edema. 2+ pedal pulses. No carotid bruits.  Abdomen: mild epigastric pain , non distended, soft abdomen. BS+ Musculoskeletal: no clubbing / cyanosis. No joint deformity upper and lower extremities. Good ROM, no contractures. Normal muscle tone.  Skin: no rashes, lesions,  ulcers. No induration Neurologic: CN 2-12 grossly intact. Sensation intact, DTR normal. Strength 5/5 in all 4.  Psychiatric: Normal judgment and insight. Alert and oriented x 3. Normal mood.     Labs on Admission: I have personally reviewed following labs and imaging studies  CBC: Recent Labs  Lab 01/16/18 1158  WBC 10.6*  HGB 5.6*  HCT 18.7*  MCV 66.8*  PLT 169*   Basic Metabolic Panel: Recent Labs  Lab 01/16/18 1158  NA 137  K 3.3*  CL 105  CO2 21*  GLUCOSE 112*  BUN 11  CREATININE 0.62  CALCIUM 8.3*   GFR: Estimated Creatinine Clearance: 74 mL/min (by C-G formula based on SCr of 0.62 mg/dL). Liver Function Tests: Recent Labs  Lab 01/16/18 1158  AST 20  ALT 8*  ALKPHOS 50  BILITOT 0.8  PROT 7.7  ALBUMIN 4.1   Recent Labs  Lab 01/16/18 1158  LIPASE 26   No results for input(s): AMMONIA in the last 168 hours. Coagulation Profile: No results for input(s): INR, PROTIME in the last 168 hours. Cardiac Enzymes: No results for input(s): CKTOTAL, CKMB, CKMBINDEX, TROPONINI in the last 168 hours. BNP (last 3 results) No results for input(s): PROBNP in the last 8760 hours. HbA1C: No results for input(s): HGBA1C in the last 72 hours. CBG: No results for input(s): GLUCAP in the last 168 hours. Lipid Profile: No results for input(s): CHOL, HDL, LDLCALC, TRIG, CHOLHDL, LDLDIRECT in the last 72 hours. Thyroid Function Tests: No results for input(s): TSH, T4TOTAL, FREET4, T3FREE, THYROIDAB in the last 72 hours. Anemia Panel: No results for input(s): VITAMINB12, FOLATE, FERRITIN, TIBC, IRON, RETICCTPCT in the last 72 hours. Urine analysis:    Component Value Date/Time   COLORURINE YELLOW 01/16/2018 [place] 01/16/2018 1206   LABSPEC >1.030 (H) 01/16/2018 1206   PHURINE 5.5 01/16/2018 1206   GLUCOSEU NEGATIVE 01/16/2018 1206   HGBUR NEGATIVE 01/16/2018 1206   BILIRUBINUR NEGATIVE 01/16/2018 1206   KETONESUR NEGATIVE 01/16/2018 1206    PROTEINUR NEGATIVE 01/16/2018 1206   UROBILINOGEN 0.2 04/17/2014 2139   NITRITE NEGATIVE 01/16/2018 1206   LEUKOCYTESUR NEGATIVE 01/16/2018 1206    Radiological Exams on Admission: Ct Abdomen Pelvis W Contrast  Result Date: 01/16/2018 CLINICAL DATA:  Abdominal pain.  Anemia. EXAM: CT ABDOMEN AND PELVIS WITH CONTRAST TECHNIQUE: Multidetector CT imaging of the abdomen and pelvis was performed using the standard protocol following bolus administration of intravenous contrast. CONTRAST:  157mL ISOVUE-300 IOPAMIDOL (ISOVUE-300) INJECTION 61% COMPARISON:  None FINDINGS: Lower chest: No acute abnormality. Hepatobiliary: No focal liver abnormality is seen. No gallstones, gallbladder wall thickening, or biliary dilatation. Pancreas: Unremarkable. No pancreatic ductal dilatation or surrounding inflammatory changes. Spleen: Normal in size without focal abnormality. Adrenals/Urinary Tract: The adrenal glands appear normal. Unremarkable appearance of the kidneys. No mass or hydronephrosis. The urinary bladder appears normal. Stomach/Bowel: The stomach is normal. The small bowel loops have  a normal course and caliber. No evidence for obstruction. The appendix is visualized and appears normal. No pathologic dilatation of the colon. Vascular/Lymphatic: Normal appearance of the abdominal aorta. No enlarged retroperitoneal or mesenteric adenopathy. No enlarged pelvic or inguinal lymph nodes. Reproductive: Markedly enlarged fibroid uterus is identified. The uterus measures 10.8 x 14.1 x 18.9 cm (volume = 1510 cm^3). Cyst in the left ovary measures 3.7 cm. Other: No free fluid or fluid collections. Musculoskeletal: Degenerative disc disease noted within the lower lumbar spine. IMPRESSION: 1. No acute findings within the abdomen or pelvis. 2. Markedly enlarged fibroid uterus with a volume of approximately 1500 cc. 3. Left ovary cyst. Electronically Signed   By: Kerby Moors M.D.   On: 01/16/2018 13:45    EKG: Independently  reviewed. SINUS RHYTHM WITH BORDERLINE T WAVE CHANGES.   Assessment/Plan Active Problems:   Symptomatic anemia   Symptomatic anemia: - unclear etiology. Pt reports menstrual cycles with blood clots and her last cycle was one week ago, but she continues to have bleeding when she wipes .  - admit for blood transfusions. Ordered 2 units of prbc transfusion, type and screen.  Keep hemoglobin greater than 7.  H&h every 8 hours.  Start her on PPI, her stool for occult blood is negative.  Will get GI evaluation in am as her sister reported green stools .  Symptomatic management with IV fluids, IV anti emetics, IV fluids an IV PPI.  Anemia panel ordered.    HYPOKALEMIA:  Replace and repeat in am.   Fever, mild leukocytosis:  - unclear etiology.  - ct ABD does not show any colitis.  UA is negative.  CXR pending.  Influenza pcr is negative.       DVT prophylaxis: scd's Code Status:  Fullcode.  Family Communication: sister at bedside.  Disposition Plan: evaluation of anemia.  Consults called: will call gi/ gyn consult in am.  Admission status: inpatient/ tele.    Hosie Poisson MD [facility] Pager [phone]  If 7PM-7AM, please contact night-coverage [email or web address] Password [facility]  01/16/2018, 5:27 PM     Addendum:  Patient didn't receive by 11 pm last night, and her anemia panel shows severe iron deficiency. Ordered IV iron. And called GI consult for possible EGD. sheis NPO after midnight.  Getting US pelvis and transvaginal US and please call GYN in am after the [place].    Hosie Poisson, MD [phone]

## 2018-01-17 ENCOUNTER — Inpatient Hospital Stay (HOSPITAL_COMMUNITY): Payer: BLUE CROSS/BLUE SHIELD

## 2018-01-17 ENCOUNTER — Encounter (HOSPITAL_COMMUNITY)
Admission: EM | Disposition: A | Discharge status: Home / Self Care | Payer: Self-pay | Source: Home / Self Care | Attending: Internal Medicine

## 2018-01-17 ENCOUNTER — Inpatient Hospital Stay (HOSPITAL_COMMUNITY): Payer: BLUE CROSS/BLUE SHIELD | Admitting: Anesthesiology

## 2018-01-17 ENCOUNTER — Encounter (HOSPITAL_COMMUNITY): Payer: Self-pay | Admitting: Anesthesiology

## 2018-01-17 DIAGNOSIS — R58 Hemorrhage, not elsewhere classified: Secondary | ICD-10-CM

## 2018-01-17 DIAGNOSIS — E876 Hypokalemia: Secondary | ICD-10-CM

## 2018-01-17 DIAGNOSIS — R509 Fever, unspecified: Secondary | ICD-10-CM | POA: Diagnosis present

## 2018-01-17 DIAGNOSIS — Z9289 Personal history of other medical treatment: Secondary | ICD-10-CM

## 2018-01-17 DIAGNOSIS — D500 Iron deficiency anemia secondary to blood loss (chronic): Secondary | ICD-10-CM | POA: Diagnosis present

## 2018-01-17 HISTORY — PX: ESOPHAGOGASTRODUODENOSCOPY: SHX5428

## 2018-01-17 HISTORY — DX: Personal history of other medical treatment: Z92.89

## 2018-01-17 LAB — BASIC METABOLIC PANEL
ANION GAP: 11 (ref 5–15)
BUN: 6 mg/dL (ref 6–20)
CHLORIDE: 108 mmol/L (ref 101–111)
CO2: 21 mmol/L — ABNORMAL LOW (ref 22–32)
Calcium: 8 mg/dL — ABNORMAL LOW (ref 8.9–10.3)
Creatinine, Ser: 0.59 mg/dL (ref 0.44–1.00)
GFR calc Af Amer: >60 mL/min (ref >60)
GFR calc non Af Amer: >60 mL/min (ref >60)
GLUCOSE: 98 mg/dL (ref 65–99)
POTASSIUM: 3.5 mmol/L (ref 3.5–5.1)
Sodium: 140 mmol/L (ref 135–145)

## 2018-01-17 LAB — HEMOGLOBIN AND HEMATOCRIT, BLOOD
HCT: 26.9 % — ABNORMAL LOW (ref 36.0–46.0)
HEMATOCRIT: 26.3 % — AB (ref 36.0–46.0)
HEMOGLOBIN: 8.2 g/dL — AB (ref 12.0–15.0)
Hemoglobin: 8.1 g/dL — ABNORMAL LOW (ref 12.0–15.0)

## 2018-01-17 LAB — HIV ANTIBODY (ROUTINE TESTING W REFLEX): HIV Screen 4th Generation wRfx: NONREACTIVE

## 2018-01-17 LAB — PREPARE RBC (CROSSMATCH)

## 2018-01-17 SURGERY — EGD (ESOPHAGOGASTRODUODENOSCOPY)
Anesthesia: Monitor Anesthesia Care

## 2018-01-17 MED ORDER — PROPOFOL 10 MG/ML IV BOLUS
INTRAVENOUS | Status: DC | PRN
  Administered 2018-01-17 (×3): 20 mg via INTRAVENOUS

## 2018-01-17 MED ORDER — SODIUM CHLORIDE 0.9 % IV SOLN: INTRAVENOUS | Status: DC

## 2018-01-17 MED ORDER — PROPOFOL 500 MG/50ML IV EMUL
INTRAVENOUS | Status: DC | PRN
  Administered 2018-01-17: 140 ug/kg/min via INTRAVENOUS

## 2018-01-17 MED ORDER — FAMOTIDINE 20 MG PO TABS
20.0000 mg | ORAL_TABLET | Freq: Two times a day (BID) | ORAL | Status: DC
  Administered 2018-01-17 – 2018-01-19 (×5): 20 mg via ORAL
  Filled 2018-01-17 (×5): qty 1

## 2018-01-17 MED ORDER — PROPOFOL 10 MG/ML IV BOLUS
INTRAVENOUS | Status: AC
  Filled 2018-01-17: qty 40

## 2018-01-17 MED ORDER — LIDOCAINE 2% (20 MG/ML) 5 ML SYRINGE
INTRAMUSCULAR | Status: DC | PRN
  Administered 2018-01-17: 100 mg via INTRAVENOUS

## 2018-01-17 NOTE — Consult Note (Signed)
[facility] CONSULT Reason for consult: Nausea vomiting and anemia Referring Physician: [facility].  PCP: None primary GI: None patient is unassigned Linda Baldwin is an 47 y.o. female.  HPI: She is basically fairly healthy and presented to the [facility] emergency room with nausea vomiting and vague abdominal pain.  She works 2 jobs in [employer] in the [employer] as well as early morning at [employer].  She ate [employer] biscuits felt pretty well and then became ill and vomited.  She has had fairly heavy periods and has just completed her menses with the last several days being fairly heavy.  She was taking Aleve throughout her period due to cramps.  Due to the abdominal pain a CT scan was obtained showing a very large fibroid uterus.  The patient has not had prior abdominal pain, melena or hematochezia.  She has no heartburn or indigestion on a chronic basis.  She has had no change in bowel habits.  She has had a family history of breast cancer but no family history of any GI cancer.  Past Medical History:  Diagnosis Date  . Heart murmur     History reviewed. No pertinent surgical history.  History reviewed. No pertinent family history.  Social History:  reports that she has never smoked. She has never used smokeless tobacco. She reports that she does not drink alcohol or use drugs.  Allergies: No Known Allergies  Medications; Prior to Admission medications   Medication Sig Start Date End Date Taking? Authorizing Provider  naproxen sodium (ALEVE) 220 MG tablet Take 440 mg by mouth daily as needed (menustral pain).   Yes [provider]   . pantoprazole (PROTONIX) IV  40 mg Intravenous Q24H   PRN Meds acetaminophen, ondansetron (ZOFRAN) IV Results for orders placed or performed during the hospital encounter of 01/16/18 (from the past 48 hour(s))  Lipase, blood     Status: None   Collection Time: 01/16/18 11:58 AM  Result Value Ref  Range   Lipase 26 11 - 51 U/L    Comment: Performed at [facility], [address] [place], [place] [address]  Comprehensive metabolic panel     Status: Abnormal   Collection Time: 01/16/18 11:58 AM  Result Value Ref Range   Sodium 137 135 - 145 mmol/L   Potassium 3.3 (L) 3.5 - 5.1 mmol/L   Chloride 105 101 - 111 mmol/L   CO2 21 (L) 22 - 32 mmol/L   Glucose, Bld 112 (H) 65 - 99 mg/dL   BUN 11 6 - 20 mg/dL   Creatinine, Ser 0.62 0.44 - 1.00 mg/dL   Calcium 8.3 (L) 8.9 - 10.3 mg/dL   Total Protein 7.7 6.5 - 8.1 g/dL   Albumin 4.1 3.5 - 5.0 g/dL   AST 20 15 - 41 U/L   ALT 8 (L) 14 - 54 U/L   Alkaline Phosphatase 50 38 - 126 U/L   Total Bilirubin 0.8 0.3 - 1.2 mg/dL   GFR calc non Af Amer >60 >60 mL/min   GFR calc Af Amer >60 >60 mL/min    Comment: (NOTE) The eGFR has been calculated using the CKD EPI equation. This calculation has not been validated in all clinical situations. eGFR's persistently <60 mL/min signify possible Chronic Kidney Disease.    Anion gap 11 5 - 15    Comment: Performed at [facility], [address] [place], [place] [address]  CBC  Status: Abnormal   Collection Time: 01/16/18 11:58 AM  Result Value Ref Range   WBC 10.6 (H) 4.0 - 10.5 K/uL   RBC 2.80 (L) 3.87 - 5.11 MIL/uL   Hemoglobin 5.6 (LL) 12.0 - 15.0 g/dL    Comment: REPEATED TO VERIFY SPECIMEN CHECKED FOR CLOTS CRITICAL RESULT CALLED TO, READ BACK BY AND VERIFIED WITH: REED C RN [id number] [phone] PHILLIPS C    HCT 18.7 (L) 36.0 - 46.0 %   MCV 66.8 (L) 78.0 - 100.0 fL   MCH 20.0 (L) 26.0 - 34.0 pg   MCHC 29.9 (L) 30.0 - 36.0 g/dL   RDW 28.0 (H) 11.5 - 15.5 %   Platelets 604 (H) 150 - 400 K/uL    Comment: PLATELET COUNT CONFIRMED BY SMEAR GIANT PLATELETS SEEN Performed at [facility], [address] [place], [place] [address]   Urinalysis, Routine w reflex microscopic     Status: Abnormal   Collection Time: 01/16/18 12:06 PM  Result Value Ref Range    Color, Urine YELLOW YELLOW   APPearance CLEAR CLEAR   Specific Gravity, Urine >1.030 (H) 1.005 - 1.030   pH 5.5 5.0 - 8.0   Glucose, UA NEGATIVE NEGATIVE mg/dL   Hgb urine dipstick NEGATIVE NEGATIVE   Bilirubin Urine NEGATIVE NEGATIVE   Ketones, ur NEGATIVE NEGATIVE mg/dL   Protein, ur NEGATIVE NEGATIVE mg/dL   Nitrite NEGATIVE NEGATIVE   Leukocytes, UA NEGATIVE NEGATIVE    Comment: Microscopic not done on urines with negative protein, blood, leukocytes, nitrite, or glucose < 500 mg/dL. Performed at [facility], [address] [place], [place] [address]   Pregnancy, urine     Status: None   Collection Time: 01/16/18 12:06 PM  Result Value Ref Range   Preg Test, Ur NEGATIVE NEGATIVE    Comment:        THE SENSITIVITY OF THIS METHODOLOGY IS >20 mIU/mL. Performed at [facility], [address] [place], [place] [address]   Occult blood card to lab, stool RN will collect     Status: None   Collection Time: 01/16/18 12:42 PM  Result Value Ref Range   Fecal Occult Bld NEGATIVE NEGATIVE    Comment: Performed at [facility], [address] [place], [place] [address]  Influenza panel by PCR (type A & B)     Status: None   Collection Time: 01/16/18  3:44 PM  Result Value Ref Range   Influenza A By PCR NEGATIVE NEGATIVE   Influenza B By PCR NEGATIVE NEGATIVE    Comment: (NOTE) The Xpert Xpress Flu assay is intended as an aid in the diagnosis of  influenza and should not be used as a sole basis for treatment.  This  assay is FDA approved for nasopharyngeal swab specimens only. Nasal  washings and aspirates are unacceptable for Xpert Xpress Flu testing. Performed at [facility], [place] [address], [place], [place] [address]   Vitamin B12     Status: None   Collection Time: 01/16/18  3:44 PM  Result Value Ref Range   Vitamin B-12 310 180 - 914 pg/mL    Comment: (NOTE) This assay is not validated for testing neonatal or myeloproliferative  syndrome specimens for Vitamin B12 levels. Performed at [facility], [place] [address], [place], [place] [address]   Folate     Status: None   Collection Time: 01/16/18  3:44 PM  Result Value Ref  Range   Folate 26.0 >5.9 ng/mL    Comment: Performed at [facility], [place] [address] [place], [place] [address]  Iron and TIBC     Status: Abnormal   Collection Time: 01/16/18  3:44 PM  Result Value Ref Range   Iron 17 (L) 28 - 170 ug/dL   TIBC 468 (H) 250 - 450 ug/dL   Saturation Ratios 4 (L) 10.4 - 31.8 %   UIBC 451 ug/dL    Comment: Performed at [facility], [place] [address] [place], [place] [address]  Ferritin     Status: Abnormal   Collection Time: 01/16/18  3:44 PM  Result Value Ref Range   Ferritin 3 (L) 11 - 307 ng/mL    Comment: Performed at [facility], [place] [address] [place], [place] [address]  Reticulocytes     Status: Abnormal   Collection Time: 01/16/18  3:44 PM  Result Value Ref Range   Retic Ct Pct 1.5 0.4 - 3.1 %   RBC. 2.59 (L) 3.87 - 5.11 MIL/uL   Retic Count, Absolute 38.9 19.0 - 186.0 K/uL    Comment: Performed at [facility], [address], [place], [place] [address]  Culture, blood (Routine X 2) w Reflex to ID Panel     Status: None (Preliminary result)   Collection Time: 01/16/18  6:00 PM  Result Value Ref Range   Specimen Description BLOOD LEFT HAND    Special Requests      BOTTLES DRAWN AEROBIC ONLY Blood Culture adequate volume   Culture PENDING    Report Status PENDING   Type and screen [place]     Status: None (Preliminary result)   Collection Time: 01/16/18  6:00 PM  Result Value Ref Range   ABO/RH(D) O POS    Antibody Screen POS    Sample Expiration 01/19/2018    Antibody Identification ANTI E    PT AG Type NEGATIVE FOR E ANTIGEN    Unit Number [id number]    Blood Component Type RED CELLS,LR    Unit division 00    Status of Unit ISSUED    Donor AG Type NEGATIVE FOR E ANTIGEN     Transfusion Status OK TO TRANSFUSE    Crossmatch Result COMPATIBLE    Unit Number [id number]    Blood Component Type RED CELLS,LR    Unit division 00    Status of Unit ISSUED    Donor AG Type NEGATIVE FOR E ANTIGEN    Transfusion Status OK TO TRANSFUSE    Crossmatch Result COMPATIBLE   Culture, blood (Routine X 2) w Reflex to ID Panel     Status: None (Preliminary result)   Collection Time: 01/16/18  6:09 PM  Result Value Ref Range   Specimen Description BLOOD RIGHT ARM    Special Requests      BOTTLES DRAWN AEROBIC AND ANAEROBIC Blood Culture adequate volume   Culture PENDING    Report Status PENDING   Prepare RBC     Status: None   Collection Time: 01/16/18  6:09 PM  Result Value Ref Range   Order Confirmation      ORDER PROCESSED BY BLOOD BANK Performed at [facility], [place] [address], [place], [place] [address]   Hemoglobin and hematocrit, blood     Status: Abnormal   Collection Time: 01/16/18  9:43 PM  Result Value Ref Range   Hemoglobin 5.4 (LL) 12.0 - 15.0 g/dL    Comment:  REPEATED TO VERIFY CRITICAL RESULT CALLED TO, READ BACK BY AND VERIFIED WITH: T CERIC,RN 01/16/18 2214 RHOLMES    HCT 18.2 (L) 36.0 - 46.0 %    Comment: Performed at [facility], [place] [address] [place], [place] [address]  Prepare RBC (crossmatch)     Status: None   Collection Time: 01/16/18  9:43 PM  Result Value Ref Range   Order Confirmation      BB SAMPLE OR UNITS ALREADY AVAILABLE Performed at [facility], [place] [address] [place], [place] [address]   Prepare RBC     Status: None   Collection Time: 01/16/18 10:45 PM  Result Value Ref Range   Order Confirmation      BB SAMPLE OR UNITS ALREADY AVAILABLE Performed at [place] [address], [place], [place] [address]   Hemoglobin and hematocrit, blood     Status: Abnormal   Collection Time: 01/17/18  9:13 AM  Result Value Ref Range   Hemoglobin 8.2 (L)  12.0 - 15.0 g/dL    Comment: DELTA CHECK NOTED POST TRANSFUSION SPECIMEN    HCT 26.3 (L) 36.0 - 46.0 %    Comment: Performed at [facility], [place] [address], [place], [place] [address]  Basic metabolic panel     Status: Abnormal   Collection Time: 01/17/18  9:13 AM  Result Value Ref Range   Sodium 140 135 - 145 mmol/L   Potassium 3.5 3.5 - 5.1 mmol/L   Chloride 108 101 - 111 mmol/L   CO2 21 (L) 22 - 32 mmol/L   Glucose, Bld 98 65 - 99 mg/dL   BUN 6 6 - 20 mg/dL   Creatinine, Ser 0.59 0.44 - 1.00 mg/dL   Calcium 8.0 (L) 8.9 - 10.3 mg/dL   GFR calc non Af Amer >60 >60 mL/min   GFR calc Af Amer >60 >60 mL/min    Comment: (NOTE) The eGFR has been calculated using the CKD EPI equation. This calculation has not been validated in all clinical situations. eGFR's persistently <60 mL/min signify possible Chronic Kidney Disease.    Anion gap 11 5 - 15    Comment: Performed at [facility], [place] [address], [place], [place] [address]    Dg Chest 2 View  Result Date: 01/17/2018 CLINICAL DATA:  Fever with nausea, vomiting and diarrhea since Saturday. EXAM: CHEST - 2 VIEW COMPARISON:  11/05/2012 FINDINGS: The heart size and mediastinal contours are within normal limits. Subtle opacity in the right middle lobe distribution may reflect a small focus of pneumonia or atelectasis. Streaky retrocardiac opacities likely vascular in etiology versus atelectasis and/or scarring is stable in appearance. IMPRESSION: Subtle opacity in the right middle lobe distribution suspicious for small focus of pneumonia or atelectasis. Electronically Signed   By: [place] Royalty M.D.   On: 01/17/2018 03:30   Ct Abdomen Pelvis W Contrast  Result Date: 01/16/2018 CLINICAL DATA:  Abdominal pain.  Anemia. EXAM: CT ABDOMEN AND PELVIS WITH CONTRAST TECHNIQUE: Multidetector CT imaging of the abdomen and pelvis was performed using the standard protocol following bolus administration of intravenous  contrast. CONTRAST:  152m ISOVUE-300 IOPAMIDOL (ISOVUE-300) INJECTION 61% COMPARISON:  None FINDINGS: Lower chest: No acute abnormality. Hepatobiliary: No focal liver abnormality is seen. No gallstones, gallbladder wall thickening, or biliary dilatation. Pancreas: Unremarkable. No pancreatic ductal dilatation or surrounding inflammatory changes. Spleen: Normal in size without focal abnormality. Adrenals/Urinary Tract: The adrenal glands appear normal. Unremarkable appearance of the kidneys.  No mass or hydronephrosis. The urinary bladder appears normal. Stomach/Bowel: The stomach is normal. The small bowel loops have a normal course and caliber. No evidence for obstruction. The appendix is visualized and appears normal. No pathologic dilatation of the colon. Vascular/Lymphatic: Normal appearance of the abdominal aorta. No enlarged retroperitoneal or mesenteric adenopathy. No enlarged pelvic or inguinal lymph nodes. Reproductive: Markedly enlarged fibroid uterus is identified. The uterus measures 10.8 x 14.1 x 18.9 cm (volume = 1510 cm^3). Cyst in the left ovary measures 3.7 cm. Other: No free fluid or fluid collections. Musculoskeletal: Degenerative disc disease noted within the lower lumbar spine. IMPRESSION: 1. No acute findings within the abdomen or pelvis. 2. Markedly enlarged fibroid uterus with a volume of approximately 1500 cc. 3. Left ovary cyst. Electronically Signed   By: Kerby Moors M.D.   On: 01/16/2018 13:45   ROS: As above            Blood pressure 95/63, pulse 78, temperature 98.7 F (37.1 C), temperature source Oral, resp. rate 18, height 5' (1.524 m), weight 72.2 kg (159 lb 2.8 oz), last menstrual period 01/16/2018, SpO2 99 %.  Physical exam:   General--Pleasant African-American female in no acute distress ENT--anicteric Neck--supple Heart--regular rate and rhythm without murmurs or gallops Lungs--clear Abdomen--nondistended abdomen soft with good bowel sounds.  The uterus  is palpable and markedly enlarged Psych--alert and oriented answers questions appropriately   Assessment: 1.  Nausea vomiting with profound anemia.  Her stools were negative but hemoglobin was 5.4 and iron was very low.  It may be that this is all due to heavy menses and an enlarged fibroid uterus but given the fact that she did have vomiting and has been taking Aleve I think she should have EGD. 2.  Profound iron deficiency anemia 3.  Markedly enlarged uterus  Plan: 1.  We will proceed with EGD this afternoon.  If this is negative, we could go ahead and feed her and arrange follow-up with GI as well as GYN.   Nancy Fetter 01/17/2018, 11:41 AM   This note was created using voice recognition software and minor errors may Have occurred unintentionally. Pager: [phone] If no answer or after hours call [phone]

## 2018-01-17 NOTE — Transfer of Care (Signed)
Immediate Anesthesia Transfer of Care Note  Patient: Linda Baldwin  Procedure(s) Performed: ESOPHAGOGASTRODUODENOSCOPY (EGD) (N/A )  Patient Location: Endoscopy Unit  Anesthesia Type:MAC  Level of Consciousness: awake  Airway & Oxygen Therapy: Patient Spontanous Breathing and Patient connected to nasal cannula oxygen  Post-op Assessment: Report given to RN and Post -op Vital signs reviewed and stable  Post vital signs: Reviewed and stable  Last Vitals:  Vitals Value Taken Time  BP    Temp    Pulse    Resp    SpO2      Last Pain:  Vitals:   01/17/18 1350  TempSrc: Oral  PainSc:          Complications: No apparent anesthesia complications

## 2018-01-17 NOTE — Discharge Summary (Signed)
PROGRESS NOTE  Linda Baldwin YNW:[id number] DOB: Sep 18, 1971 DOA: 01/16/2018 PCP: Patient, No Pcp Per  HPI/Recap of past 24 hours: Linda Baldwin is a 47 y.o. female with medical history significant of anemia, presented to [facility] ED for  Nausea, vomiting and abdominal pain since yesterday. Pt reports she ate chicken outside and sicne then has not been feeling good. Sister at bedside, reports that she has been anemic for a long time. She denies any fevers or chills at home. Abdominal pain is in the epigastric area, non radiating, associated with nausea, vomiting and diarrhea. Pt 's sister reports green watery stool. No sob, chest pain, URI symptoms, no sick contacts or similar complaints in the past.  On arrival to ED, she was found to be febrile, slightly tachycardic. Her hemoglobin was found to be around 5, stool for guiac negative. CT abd shows a fibroid uterus with a volume of 1500 ml.  She was referred to medical service for evaluation of symptomatic anemia.   01/17/2018: Patient seen and examined at her bedside.  She denies abdominal pain nausea or vomiting.  Reports she continues to have vaginal bleeding from her menses.  Spoke with gynecologist on call who is arranging for a outpatient follow-up.  Patient had EGD done this morning which was unremarkable.   Assessment/Plan: Active Problems:   Symptomatic anemia   Hypokalemia   Iron deficiency anemia due to chronic blood loss   Fever  Intractable nausea vomiting GI consulted and signed off today EGD done today 01/17/2018 revealed mild esophagitis most likely from retching Symptoms are improving Resume diet as tolerated IV Zofran as needed for nausea  Severe iron deficiency anemia Hemoglobin of 5.4 on presentation CT abdomen and pelvis revealed large fibroids Gynecology consulted and recommended outpatient follow-up Started on IV ferrous supplement Received 2 units of PRBC Repeated hemoglobin 8.1 today Repeat CBC in the  morning  Large uterine fibroids Gynecology contacted and recommend outpatient follow-up  Heavy menses Monitor H&H and repeat CBC in the morning  Obesity BMI 31 Weight loss outpatient  Hypokalemia Repleted with p.o. potassium  Code Status: Full code  Family Communication: None at bedside  Disposition Plan: Home when clinically stable    Consultants:  GI  Gynecology  Procedures:  EGD 01/17/2018  Antimicrobials:  None  DVT prophylaxis: SCDs   Objective: Vitals:   01/17/18 1413 01/17/18 1420 01/17/18 1430 01/17/18 1454  BP: 102/68 105/73 126/75 112/74  Pulse: 87 77 80 79  Resp: 18 (!) 21 20 20   Temp: 98.5 F (36.9 C)   98.2 F (36.8 C)  TempSrc: Oral   Oral  SpO2: 100% 100% 100% 98%  Weight:      Height:        Intake/Output Summary (Last 24 hours) at 01/17/2018 1604 Last data filed at 01/17/2018 1413 Gross per 24 hour  Intake 1717.17 ml  Output 1 ml  Net 1716.17 ml   Filed Weights   01/16/18 1126 01/16/18 1758 01/17/18 1350  Weight: 68 kg (150 lb) 72.2 kg (159 lb 2.8 oz) 72.1 kg (159 lb)    Exam:   General: 47 year old African-American female well-developed well-nourished no acute distress.  Alert and oriented x3.  Cardiovascular: Regular rate and rhythm with no rubs or gallops.  No JVD or thyromegaly present.  Respiratory: Clear to auscultation with no wheezes or rales.  Abdomen: Soft nontender nondistended with normal bowel sounds x4 quadrant.  Musculoskeletal: No lower extremity edema.  Skin: No ulcerative lesion.  Psychiatry: Mood  is appropriate for condition and setting.   Data Reviewed: CBC: Recent Labs  Lab 01/16/18 1158 01/16/18 2143 01/17/18 0913  WBC 10.6*  --   --   HGB 5.6* 5.4* 8.2*  HCT 18.7* 18.2* 26.3*  MCV 66.8*  --   --   PLT 604*  --   --    Basic Metabolic Panel: Recent Labs  Lab 01/16/18 1158 01/17/18 0913  NA 137 140  K 3.3* 3.5  CL 105 108  CO2 21* 21*  GLUCOSE 112* 98  BUN 11 6  CREATININE  0.62 0.59  CALCIUM 8.3* 8.0*   GFR: Estimated Creatinine Clearance: 76.2 mL/min (by C-G formula based on SCr of 0.59 mg/dL). Liver Function Tests: Recent Labs  Lab 01/16/18 1158  AST 20  ALT 8*  ALKPHOS 50  BILITOT 0.8  PROT 7.7  ALBUMIN 4.1   Recent Labs  Lab 01/16/18 1158  LIPASE 26   No results for input(s): AMMONIA in the last 168 hours. Coagulation Profile: No results for input(s): INR, PROTIME in the last 168 hours. Cardiac Enzymes: No results for input(s): CKTOTAL, CKMB, CKMBINDEX, TROPONINI in the last 168 hours. BNP (last 3 results) No results for input(s): PROBNP in the last 8760 hours. HbA1C: No results for input(s): HGBA1C in the last 72 hours. CBG: No results for input(s): GLUCAP in the last 168 hours. Lipid Profile: No results for input(s): CHOL, HDL, LDLCALC, TRIG, CHOLHDL, LDLDIRECT in the last 72 hours. Thyroid Function Tests: No results for input(s): TSH, T4TOTAL, FREET4, T3FREE, THYROIDAB in the last 72 hours. Anemia Panel: Recent Labs    01/16/18 1544  VITAMINB12 310  FOLATE 26.0  FERRITIN 3*  TIBC 468*  IRON 17*  RETICCTPCT 1.5   Urine analysis:    Component Value Date/Time   COLORURINE YELLOW 01/16/2018 1206   APPEARANCEUR CLEAR 01/16/2018 1206   LABSPEC >1.030 (H) 01/16/2018 1206   PHURINE 5.5 01/16/2018 1206   GLUCOSEU NEGATIVE 01/16/2018 1206   HGBUR NEGATIVE 01/16/2018 1206   BILIRUBINUR NEGATIVE 01/16/2018 1206   KETONESUR NEGATIVE 01/16/2018 1206   PROTEINUR NEGATIVE 01/16/2018 1206   UROBILINOGEN 0.2 04/17/2014 2139   NITRITE NEGATIVE 01/16/2018 1206   LEUKOCYTESUR NEGATIVE 01/16/2018 1206   Sepsis Labs: @LABRCNTIP (procalcitonin:4,lacticidven:4)  ) Recent Results (from the past 240 hour(s))  Culture, blood (Routine X 2) w Reflex to ID Panel     Status: None (Preliminary result)   Collection Time: 01/16/18  6:00 PM  Result Value Ref Range Status   Specimen Description BLOOD LEFT HAND  Final   Special Requests   Final     BOTTLES DRAWN AEROBIC ONLY Blood Culture adequate volume   Culture PENDING  Incomplete   Report Status PENDING  Incomplete  Culture, blood (Routine X 2) w Reflex to ID Panel     Status: None (Preliminary result)   Collection Time: 01/16/18  6:09 PM  Result Value Ref Range Status   Specimen Description BLOOD RIGHT ARM  Final   Special Requests   Final    BOTTLES DRAWN AEROBIC AND ANAEROBIC Blood Culture adequate volume   Culture PENDING  Incomplete   Report Status PENDING  Incomplete      Studies: Dg Chest 2 View  Result Date: 01/17/2018 CLINICAL DATA:  Fever with nausea, vomiting and diarrhea since Saturday. EXAM: CHEST - 2 VIEW COMPARISON:  11/05/2012 FINDINGS: The heart size and mediastinal contours are within normal limits. Subtle opacity in the right middle lobe distribution may reflect a small focus of pneumonia  or atelectasis. Streaky retrocardiac opacities likely vascular in etiology versus atelectasis and/or scarring is stable in appearance. IMPRESSION: Subtle opacity in the right middle lobe distribution suspicious for small focus of pneumonia or atelectasis. Electronically Signed   By: [place] Royalty M.D.   On: 01/17/2018 03:30   US Pelvic Complete With Transvaginal  Result Date: 01/17/2018 CLINICAL DATA:  Vaginal bleeding. EXAM: TRANSABDOMINAL AND TRANSVAGINAL ULTRASOUND OF PELVIS TECHNIQUE: Both transabdominal and transvaginal ultrasound examinations of the pelvis were performed. Transabdominal technique was performed for global imaging of the pelvis including uterus, ovaries, adnexal regions, and pelvic cul-de-sac. It was necessary to proceed with endovaginal exam following the transabdominal exam to visualize the ovaries. COMPARISON:  CT abdomen and pelvis 01/16/2018 FINDINGS: Uterus Measurements: 17.3 x 11.6 x 13.3 cm. Multiple intramural masses compatible with fibroids as seen on CT including a 4 x 4 x 3 cm fibroid in the anterior fundus and 2 fibroids posteriorly in the mid  uterine segment measuring 4.3 x 4.5 x 4.3 cm on the left and 6.3 x 6.8 x 4.5 cm on the right. Endometrium Thickness: 14 mm.  Endometrium distorted by the fibroids. Right ovary Measurements: 4.2 x 2.2 x 1.7 cm. Normal appearance/no adnexal mass. Left ovary Measurements: 4.4 x 3.1 x 4.1 cm. 4.1 x 2.0 x 1.6 cm benign appearing cyst. Other findings No abnormal free fluid. IMPRESSION: 1. Multiple uterine fibroids. 2. Benign appearing left ovarian cyst.  Unremarkable right ovary. Electronically Signed   By: Logan Bores M.D.   On: 01/17/2018 15:59    Scheduled Meds: . famotidine  20 mg Oral BID  . pantoprazole (PROTONIX) IV  40 mg Intravenous Q24H    Continuous Infusions:   LOS: 1 day     Kayleen Memos, MD [facility] Pager [phone]  If 7PM-7AM, please contact night-coverage [email or web address] Password [email or web address] 01/17/2018, 4:04 PM

## 2018-01-17 NOTE — Op Note (Signed)
[facility] Patient Name: Linda Baldwin Procedure Date: 01/17/2018 MRN: [id number] Attending MD: Nancy Fetter Dr., MD Date of Birth: October 16, 1971 CSN: [id number] Age: 47 Admit Type: Inpatient Procedure:                Upper GI endoscopy Indications:              Iron deficiency anemia, Nausea with vomiting, large                            uterine fibroids on CT scan Providers:                Jeneen Rinks L. Fedor Kazmierski Dr., MD, Zenon Mayo, RN, Elspeth Cho Tech., Technician, Danley Danker, CRNA Referring MD:             [facility] Medicines:                Monitored Anesthesia Care Complications:            No immediate complications. Estimated Blood Loss:     Estimated blood loss: none. Procedure:                Pre-Anesthesia Assessment:                           - Prior to the procedure, a History and Physical                            was performed, and patient medications and                            allergies were reviewed. The patient's tolerance of                            previous anesthesia was also reviewed. The risks                            and benefits of the procedure and the sedation                            options and risks were discussed with the patient.                            All questions were answered, and informed consent                            was obtained. Prior Anticoagulants: The patient has                            taken no previous anticoagulant or antiplatelet                            agents. ASA Grade Assessment: II - A patient with  mild systemic disease. After reviewing the risks                            and benefits, the patient was deemed in                            satisfactory condition to undergo the procedure.                           After obtaining informed consent, the endoscope was                            passed under direct vision. Throughout  the                            procedure, the patient's blood pressure, pulse, and                            oxygen saturations were monitored continuously. The                            EG-[id number]) scope was introduced through the                            mouth, and advanced to the second part of duodenum.                            The upper GI endoscopy was accomplished without                            difficulty. The patient tolerated the procedure                            well. Scope In: Scope Out: Findings:      Non-severe esophagitis with no bleeding was found at the       gastroesophageal junction.      The stomach was normal.      The examined duodenum was normal. Impression:               - Non-severe reflux esophagitis.                           - Normal stomach.                           - Normal examined duodenum.                           - No specimens collected. Moderate Sedation:      See anesthesia note, no moderate sedation. Recommendation:           - Patient has a contact number available for                            emergencies. The signs and symptoms of potential  delayed complications were discussed with the                            patient. Return to normal activities tomorrow.                            Written discharge instructions were provided to the                            patient.                           - Resume previous diet.                           - Continue present medications.                           - Use Pepcid (famotidine) 20 mg PO BID. Procedure Code(s):        --- Professional ---                           [phone], Esophagogastroduodenoscopy, flexible,                            transoral; diagnostic, including collection of                            specimen(s) by brushing or washing, when performed                            (separate procedure) Diagnosis Code(s):        ---  Professional ---                           K21.0, Gastro-esophageal reflux disease with                            esophagitis                           D50.9, Iron deficiency anemia, unspecified                           R11.2, Nausea with vomiting, unspecified CPT copyright 2016 American Medical Association. All rights reserved. The codes documented in this report are preliminary and upon coder review may  be revised to meet current compliance requirements. Nancy Fetter Dr., MD 01/17/2018 2:25:14 PM This report has been signed electronically. Number of Addenda: 0

## 2018-01-17 NOTE — Interval H&P Note (Signed)
History and Physical Interval Note:  01/17/2018 1:55 PM  Linda Baldwin  has presented today for surgery, with the diagnosis of vomiting, anemia  The various methods of treatment have been discussed with the patient and family. After consideration of risks, benefits and other options for treatment, the patient has consented to  Procedure(s): ESOPHAGOGASTRODUODENOSCOPY (EGD) (N/A) as a surgical intervention .  The patient's history has been reviewed, patient examined, no change in status, stable for surgery.  I have reviewed the patient's chart and labs.  Questions were answered to the patient's satisfaction.     Nancy Fetter

## 2018-01-17 NOTE — H&P (View-Only) (Signed)
[facility] Reason for consult: Nausea vomiting and anemia Referring Physician: [facility].  PCP: None primary GI: None patient is unassigned Linda Baldwin is an 46 y.o. female.  HPI: She is basically fairly healthy and presented to the  [place] emergency room with nausea vomiting and vague abdominal pain.  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  She ate [employer] biscuits felt pretty well and then became ill and vomited.  She has had fairly heavy periods and has just completed her menses with the last several days being fairly heavy.  She was taking Aleve throughout her period due to cramps.  Due to the abdominal pain a CT scan was obtained showing a very large fibroid uterus.  The patient has not had prior abdominal pain, melena or hematochezia.  She has no heartburn or indigestion on a chronic basis.  She has had no change in bowel habits.  She has had a family history of breast cancer but no family history of any GI cancer.  Past Medical History:  Diagnosis Date  . Heart murmur     History reviewed. No pertinent surgical history.  History reviewed. No pertinent family history.  Social History:  reports that she has never smoked. She has never used smokeless tobacco. She reports that she does not drink alcohol or use drugs.  Allergies: No Known Allergies  Medications; Prior to Admission medications   Medication Sig Start Date End Date Taking? Authorizing Provider  naproxen sodium (ALEVE) 220 MG tablet Take 440 mg by mouth daily as needed (menustral pain).   Yes [provider]   . pantoprazole (PROTONIX) IV  40 mg Intravenous Q24H   PRN Meds acetaminophen, ondansetron (ZOFRAN) IV Results for orders placed or performed during the hospital encounter of 01/16/18 (from the past 48 hour(s))  Lipase, blood     Status: None   Collection Time: 01/16/18 11:58 AM  Result Value Ref  Range   Lipase 26 11 - 51 U/L    Comment: Performed at [facility] [place], [address] [place], [place] [address]  Comprehensive metabolic panel     Status: Abnormal   Collection Time: 01/16/18 11:58 AM  Result Value Ref Range   Sodium 137 135 - 145 mmol/L   Potassium 3.3 (L) 3.5 - 5.1 mmol/L   Chloride 105 101 - 111 mmol/L   CO2 21 (L) 22 - 32 mmol/L   Glucose, Bld 112 (H) 65 - 99 mg/dL   BUN 11 6 - 20 mg/dL   Creatinine, Ser 0.62 0.44 - 1.00 mg/dL   Calcium 8.3 (L) 8.9 - 10.3 mg/dL   Total Protein 7.7 6.5 - 8.1 g/dL   Albumin 4.1 3.5 - 5.0 g/dL   AST 20 15 - 41 U/L   ALT 8 (L) 14 - 54 U/L   Alkaline Phosphatase 50 38 - 126 U/L   Total Bilirubin 0.8 0.3 - 1.2 mg/dL   GFR calc non Af Amer >60 >60 mL/min   GFR calc Af Amer >60 >60 mL/min    Comment: (NOTE) The eGFR has been calculated using the CKD EPI equation. This calculation has not been validated in all clinical situations. eGFR's persistently <60 mL/min signify possible Chronic Kidney Disease.    Anion gap 11 5 - 15    Comment: Performed at [facility] [place], [address] [place], [place] [address]  CBC       Status: Abnormal   Collection Time: 01/16/18 11:58 AM  Result Value Ref Range   WBC 10.6 (H) 4.0 - 10.5 K/uL   RBC 2.80 (L) 3.87 - 5.11 MIL/uL   Hemoglobin 5.6 (LL) 12.0 - 15.0 g/dL    Comment: REPEATED TO VERIFY SPECIMEN CHECKED FOR CLOTS CRITICAL RESULT CALLED TO, READ BACK BY AND VERIFIED WITH: REED C RN [id number] PHILLIPS C    HCT 18.7 (L) 36.0 - 46.0 %   MCV 66.8 (L) 78.0 - 100.0 fL   MCH 20.0 (L) 26.0 - 34.0 pg   MCHC 29.9 (L) 30.0 - 36.0 g/dL   RDW 28.0 (H) 11.5 - 15.5 %   Platelets 604 (H) 150 - 400 K/uL    Comment: PLATELET COUNT CONFIRMED BY SMEAR GIANT PLATELETS SEEN Performed at [facility] [place], [address] [place], [place] [address]   Urinalysis, Routine w reflex microscopic     Status: Abnormal   Collection Time: 01/16/18 12:06 PM  Result Value Ref Range    Color, Urine YELLOW YELLOW   APPearance CLEAR CLEAR   Specific Gravity, Urine >1.030 (H) 1.005 - 1.030   pH 5.5 5.0 - 8.0   Glucose, UA NEGATIVE NEGATIVE mg/dL   Hgb urine dipstick NEGATIVE NEGATIVE   Bilirubin Urine NEGATIVE NEGATIVE   Ketones, ur NEGATIVE NEGATIVE mg/dL   Protein, ur NEGATIVE NEGATIVE mg/dL   Nitrite NEGATIVE NEGATIVE   Leukocytes, UA NEGATIVE NEGATIVE    Comment: Microscopic not done on urines with negative protein, blood, leukocytes, nitrite, or glucose < 500 mg/dL. Performed at [facility] [place], [address] [place], [place] [address]   Pregnancy, urine     Status: None   Collection Time: 01/16/18 12:06 PM  Result Value Ref Range   Preg Test, Ur NEGATIVE NEGATIVE    Comment:        THE SENSITIVITY OF THIS METHODOLOGY IS >20 mIU/mL. Performed at [facility] [place], [address] [place], [place] [address]   Occult blood card to lab, stool RN will collect     Status: None   Collection Time: 01/16/18 12:42 PM  Result Value Ref Range   Fecal Occult Bld NEGATIVE NEGATIVE    Comment: Performed at [facility] [place], [address] [place], [place] [address]  Influenza panel by PCR (type A & B)     Status: None   Collection Time: 01/16/18  3:44 PM  Result Value Ref Range   Influenza A By PCR NEGATIVE NEGATIVE   Influenza B By PCR NEGATIVE NEGATIVE    Comment: (NOTE) The Xpert Xpress Flu assay is intended as an aid in the diagnosis of  influenza and should not be used as a sole basis for treatment.  This  assay is FDA approved for nasopharyngeal swab specimens only. Nasal  washings and aspirates are unacceptable for Xpert Xpress Flu testing. Performed at [facility], [address] [place], [place] [address]   Vitamin B12     Status: None   Collection Time: 01/16/18  3:44 PM  Result Value Ref Range   Vitamin B-12 310 180 - 914 pg/mL    Comment: (NOTE) This assay is not validated for testing neonatal or myeloproliferative  syndrome specimens for Vitamin B12 levels. Performed at [facility], [address] [place], [place] [address]   Folate     Status: None   Collection Time: 01/16/18  3:44 PM  Result Value Ref   Range   Folate 26.0 >5.9 ng/mL    Comment: Performed at [facility], [address] [place], [place] [address]  Iron and TIBC     Status: Abnormal   Collection Time: 01/16/18  3:44 PM  Result Value Ref Range   Iron 17 (L) 28 - 170 ug/dL   TIBC 468 (H) 250 - 450 ug/dL   Saturation Ratios 4 (L) 10.4 - 31.8 %   UIBC 451 ug/dL    Comment: Performed at [facility], [address] [place], [place] [address]  Ferritin     Status: Abnormal   Collection Time: 01/16/18  3:44 PM  Result Value Ref Range   Ferritin 3 (L) 11 - 307 ng/mL    Comment: Performed at [facility], [address] [place], [place] [address]  Reticulocytes     Status: Abnormal   Collection Time: 01/16/18  3:44 PM  Result Value Ref Range   Retic Ct Pct 1.5 0.4 - 3.1 %   RBC. 2.59 (L) 3.87 - 5.11 MIL/uL   Retic Count, Absolute 38.9 19.0 - 186.0 K/uL    Comment: Performed at [facility], [address] [place], [place] [address]  Culture, blood (Routine X 2) w Reflex to ID Panel     Status: None (Preliminary result)   Collection Time: 01/16/18  6:00 PM  Result Value Ref Range   Specimen Description BLOOD LEFT HAND    Special Requests      BOTTLES DRAWN AEROBIC ONLY Blood Culture adequate volume   Culture PENDING    Report Status PENDING   Type and screen [facility]     Status: None (Preliminary result)   Collection Time: 01/16/18  6:00 PM  Result Value Ref Range   ABO/RH(D) O POS    Antibody Screen POS    Sample Expiration 01/19/2018    Antibody Identification ANTI E    PT AG Type NEGATIVE FOR E ANTIGEN    Unit Number [id number]    Blood Component Type RED CELLS,LR    Unit division 00    Status of Unit ISSUED    Donor AG Type NEGATIVE FOR E ANTIGEN     Transfusion Status OK TO TRANSFUSE    Crossmatch Result COMPATIBLE    Unit Number [id number]    Blood Component Type RED CELLS,LR    Unit division 00    Status of Unit ISSUED    Donor AG Type NEGATIVE FOR E ANTIGEN    Transfusion Status OK TO TRANSFUSE    Crossmatch Result COMPATIBLE   Culture, blood (Routine X 2) w Reflex to ID Panel     Status: None (Preliminary result)   Collection Time: 01/16/18  6:09 PM  Result Value Ref Range   Specimen Description BLOOD RIGHT ARM    Special Requests      BOTTLES DRAWN AEROBIC AND ANAEROBIC Blood Culture adequate volume   Culture PENDING    Report Status PENDING   Prepare RBC     Status: None   Collection Time: 01/16/18  6:09 PM  Result Value Ref Range   Order Confirmation      ORDER PROCESSED BY BLOOD BANK Performed at [facility], [address] , [place] [address]   Hemoglobin and hematocrit, blood     Status: Abnormal   Collection Time: 01/16/18  9:43 PM  Result Value Ref Range   Hemoglobin 5.4 (LL) 12.0 - 15.0 g/dL    Comment:   REPEATED TO VERIFY CRITICAL RESULT CALLED TO, READ BACK BY AND VERIFIED WITH: T CERIC,RN 01/16/18 2214 RHOLMES    HCT 18.2 (L) 36.0 - 46.0 %    Comment: Performed at [facility], [address] [place], [place] [address]  Prepare RBC (crossmatch)     Status: None   Collection Time: 01/16/18  9:43 PM  Result Value Ref Range   Order Confirmation      BB SAMPLE OR UNITS ALREADY AVAILABLE Performed at [facility], [address] [place], [place] [address]   Prepare RBC     Status: None   Collection Time: 01/16/18 10:45 PM  Result Value Ref Range   Order Confirmation      BB SAMPLE OR UNITS ALREADY AVAILABLE Performed at [facility], [address] [place], [place] [address]   Hemoglobin and hematocrit, blood     Status: Abnormal   Collection Time: 01/17/18  9:13 AM  Result Value Ref Range   Hemoglobin 8.2 (L)  12.0 - 15.0 g/dL    Comment: DELTA CHECK NOTED POST TRANSFUSION SPECIMEN    HCT 26.3 (L) 36.0 - 46.0 %    Comment: Performed at [facility], [address] [place], [place] [address]  Basic metabolic panel     Status: Abnormal   Collection Time: 01/17/18  9:13 AM  Result Value Ref Range   Sodium 140 135 - 145 mmol/L   Potassium 3.5 3.5 - 5.1 mmol/L   Chloride 108 101 - 111 mmol/L   CO2 21 (L) 22 - 32 mmol/L   Glucose, Bld 98 65 - 99 mg/dL   BUN 6 6 - 20 mg/dL   Creatinine, Ser 0.59 0.44 - 1.00 mg/dL   Calcium 8.0 (L) 8.9 - 10.3 mg/dL   GFR calc non Af Amer >60 >60 mL/min   GFR calc Af Amer >60 >60 mL/min    Comment: (NOTE) The eGFR has been calculated using the CKD EPI equation. This calculation has not been validated in all clinical situations. eGFR's persistently <60 mL/min signify possible Chronic Kidney Disease.    Anion gap 11 5 - 15    Comment: Performed at [facility], [address] [place], [place] [address]    Dg Chest 2 View  Result Date: 01/17/2018 CLINICAL DATA:  Fever with nausea, vomiting and diarrhea since Saturday. EXAM: CHEST - 2 VIEW COMPARISON:  11/05/2012 FINDINGS: The heart size and mediastinal contours are within normal limits. Subtle opacity in the right middle lobe distribution may reflect a small focus of pneumonia or atelectasis. Streaky retrocardiac opacities likely vascular in etiology versus atelectasis and/or scarring is stable in appearance. IMPRESSION: Subtle opacity in the right middle lobe distribution suspicious for small focus of pneumonia or atelectasis. Electronically Signed   By: David  Kwon M.D.   On: 01/17/2018 03:30   Ct Abdomen Pelvis W Contrast  Result Date: 01/16/2018 CLINICAL DATA:  Abdominal pain.  Anemia. EXAM: CT ABDOMEN AND PELVIS WITH CONTRAST TECHNIQUE: Multidetector CT imaging of the abdomen and pelvis was performed using the standard protocol following bolus administration of intravenous  contrast. CONTRAST:  100mL ISOVUE-300 IOPAMIDOL (ISOVUE-300) INJECTION 61% COMPARISON:  None FINDINGS: Lower chest: No acute abnormality. Hepatobiliary: No focal liver abnormality is seen. No gallstones, gallbladder wall thickening, or biliary dilatation. Pancreas: Unremarkable. No pancreatic ductal dilatation or surrounding inflammatory changes. Spleen: Normal in size without focal abnormality. Adrenals/Urinary Tract: The adrenal glands appear normal. Unremarkable appearance of the kidneys.   No mass or hydronephrosis. The urinary bladder appears normal. Stomach/Bowel: The stomach is normal. The small bowel loops have a normal course and caliber. No evidence for obstruction. The appendix is visualized and appears normal. No pathologic dilatation of the colon. Vascular/Lymphatic: Normal appearance of the abdominal aorta. No enlarged retroperitoneal or mesenteric adenopathy. No enlarged pelvic or inguinal lymph nodes. Reproductive: Markedly enlarged fibroid uterus is identified. The uterus measures 10.8 x 14.1 x 18.9 cm (volume = 1510 cm^3). Cyst in the left ovary measures 3.7 cm. Other: No free fluid or fluid collections. Musculoskeletal: Degenerative disc disease noted within the lower lumbar spine. IMPRESSION: 1. No acute findings within the abdomen or pelvis. 2. Markedly enlarged fibroid uterus with a volume of approximately 1500 cc. 3. Left ovary cyst. Electronically Signed   By: Taylor  Stroud M.D.   On: 01/16/2018 13:45   ROS: As above            Blood pressure 95/63, pulse 78, temperature 98.7 F (37.1 C), temperature source Oral, resp. rate 18, height 5' (1.524 m), weight 72.2 kg (159 lb 2.8 oz), last menstrual period 01/16/2018, SpO2 99 %.  Physical exam:   General--Pleasant African-American female in no acute distress ENT--anicteric Neck--supple Heart--regular rate and rhythm without murmurs or gallops Lungs--clear Abdomen--nondistended abdomen soft with good bowel sounds.  The uterus  is palpable and markedly enlarged Psych--alert and oriented answers questions appropriately   Assessment: 1.  Nausea vomiting with profound anemia.  Her stools were negative but hemoglobin was 5.4 and iron was very low.  It may be that this is all due to heavy menses and an enlarged fibroid uterus but given the fact that she did have vomiting and has been taking Aleve I think she should have EGD. 2.  Profound iron deficiency anemia 3.  Markedly enlarged uterus  Plan: 1.  We will proceed with EGD this afternoon.  If this is negative, we could go ahead and feed her and arrange follow-up with GI as well as GYN.   Daniil Labarge L Davinity Fanara 01/17/2018, 11:41 AM   This note was created using voice recognition software and minor errors may Have occurred unintentionally. Pager: [phone] If no answer or after hours call [phone]    

## 2018-01-17 NOTE — Anesthesia Postprocedure Evaluation (Signed)
Anesthesia Post Note  Patient: Linda Baldwin  Procedure(s) Performed: ESOPHAGOGASTRODUODENOSCOPY (EGD) (N/A )     Patient location during evaluation: Endoscopy Anesthesia Type: MAC Level of consciousness: awake and alert Pain management: pain level controlled Vital Signs Assessment: post-procedure vital signs reviewed and stable Respiratory status: spontaneous breathing, nonlabored ventilation, respiratory function stable and patient connected to nasal cannula oxygen Cardiovascular status: blood pressure returned to baseline and stable Postop Assessment: no apparent nausea or vomiting Anesthetic complications: no    Last Vitals:  Vitals:   01/17/18 1430 01/17/18 1454  BP: 126/75 112/74  Pulse: 80 79  Resp: 20 20  Temp:  36.8 C  SpO2: 100% 98%    Last Pain:  Vitals:   01/17/18 1454  TempSrc: Oral  PainSc: 0-No pain                 Kolbey Teichert S

## 2018-01-17 NOTE — Anesthesia Preprocedure Evaluation (Signed)
Anesthesia Evaluation  Patient identified by MRN, date of birth, ID band Patient awake    Reviewed: Allergy & Precautions, H&P , NPO status , Patient's Chart, lab work & pertinent test results  Airway Mallampati: II   Neck ROM: full    Dental   Pulmonary neg pulmonary ROS,    breath sounds clear to auscultation       Cardiovascular negative cardio ROS   Rhythm:regular Rate:Normal     Neuro/Psych    GI/Hepatic   Endo/Other    Renal/GU      Musculoskeletal   Abdominal   Peds  Hematology  (+) Blood dyscrasia, anemia ,   Anesthesia Other Findings   Reproductive/Obstetrics                             Anesthesia Physical Anesthesia Plan  ASA: II  Anesthesia Plan: MAC   Post-op Pain Management:    Induction: Intravenous  PONV Risk Score and Plan: 2 and Propofol infusion and Treatment may vary due to age or medical condition  Airway Management Planned: Nasal Cannula  Additional Equipment:   Intra-op Plan:   Post-operative Plan:   Informed Consent: I have reviewed the patients History and Physical, chart, labs and discussed the procedure including the risks, benefits and alternatives for the proposed anesthesia with the patient or authorized representative who has indicated his/her understanding and acceptance.     Plan Discussed with: CRNA and Anesthesiologist  Anesthesia Plan Comments:         Anesthesia Quick Evaluation

## 2018-01-17 NOTE — Anesthesia Procedure Notes (Signed)
Date/Time: 01/17/2018 1:59 PM Performed by: Sharlette Dense, CRNA Oxygen Delivery Method: Nasal cannula

## 2018-01-18 DIAGNOSIS — R5081 Fever presenting with conditions classified elsewhere: Secondary | ICD-10-CM

## 2018-01-18 LAB — BASIC METABOLIC PANEL
Anion gap: 6 (ref 5–15)
BUN: 6 mg/dL (ref 6–20)
CALCIUM: 8 mg/dL — AB (ref 8.9–10.3)
CHLORIDE: 109 mmol/L (ref 101–111)
CO2: 23 mmol/L (ref 22–32)
CREATININE: 0.64 mg/dL (ref 0.44–1.00)
GFR calc Af Amer: >60 mL/min (ref >60)
Glucose, Bld: 124 mg/dL — ABNORMAL HIGH (ref 65–99)
Potassium: 3.2 mmol/L — ABNORMAL LOW (ref 3.5–5.1)
SODIUM: 138 mmol/L (ref 135–145)

## 2018-01-18 LAB — CBC
HCT: 24.9 % — ABNORMAL LOW (ref 36.0–46.0)
Hemoglobin: 7.5 g/dL — ABNORMAL LOW (ref 12.0–15.0)
MCH: 21.4 pg — AB (ref 26.0–34.0)
MCHC: 30.1 g/dL (ref 30.0–36.0)
MCV: 71.1 fL — ABNORMAL LOW (ref 78.0–100.0)
PLATELETS: 490 10*3/uL — AB (ref 150–400)
RBC: 3.5 MIL/uL — ABNORMAL LOW (ref 3.87–5.11)
RDW: 27.3 % — AB (ref 11.5–15.5)
WBC: 11.4 10*3/uL — ABNORMAL HIGH (ref 4.0–10.5)

## 2018-01-18 LAB — HEMOGLOBIN AND HEMATOCRIT, BLOOD
HCT: 24.9 % — ABNORMAL LOW (ref 36.0–46.0)
HCT: 35.6 % — ABNORMAL LOW (ref 36.0–46.0)
HCT: 35.1 % — ABNORMAL LOW (ref 36.0–46.0)
Hemoglobin: 7.7 g/dL — ABNORMAL LOW (ref 12.0–15.0)
Hemoglobin: 11.3 g/dL — ABNORMAL LOW (ref 12.0–15.0)
Hemoglobin: 10.8 g/dL — ABNORMAL LOW (ref 12.0–15.0)

## 2018-01-18 LAB — PREPARE RBC (CROSSMATCH)

## 2018-01-18 MED ORDER — SODIUM CHLORIDE 0.9 % IV SOLN
Freq: Once | INTRAVENOUS | Status: AC
  Administered 2018-01-18: 10:00:00 via INTRAVENOUS

## 2018-01-18 MED ORDER — MAGNESIUM SULFATE 2 GM/50ML IV SOLN
2.0000 g | Freq: Once | INTRAVENOUS | Status: AC
  Administered 2018-01-18: 2 g via INTRAVENOUS
  Filled 2018-01-18: qty 50

## 2018-01-18 MED ORDER — POTASSIUM CHLORIDE CRYS ER 20 MEQ PO TBCR
40.0000 meq | EXTENDED_RELEASE_TABLET | Freq: Two times a day (BID) | ORAL | Status: AC
  Administered 2018-01-18 (×2): 40 meq via ORAL
  Filled 2018-01-18 (×2): qty 2

## 2018-01-18 NOTE — Progress Notes (Signed)
[facility] PROGRESS NOTE Subjective The patient feels fine.  She is tolerating the diet without any problems.  Pelvic ultrasound showed massive uterine fibroids.  Objective: Vital signs in last 24 hours: Temp:  [98.2 F (36.8 C)-98.8 F (37.1 C)] 98.4 F (36.9 C) (04/02 0514) Pulse Rate:  [76-90] 76 (04/02 0514) Resp:  [18-21] 18 (04/02 0514) BP: (99-126)/(59-75) 99/67 (04/02 0514) SpO2:  [96 %-100 %] 98 % (04/02 0514) Weight:  [72.1 kg (159 lb)] 72.1 kg (159 lb) (04/01 1350) Last BM Date: 01/17/18  Intake/Output from previous day: 04/01 0701 - 04/02 0700 In: 432 [I.V.:100; Blood:332] Out: -  Intake/Output this shift: No intake/output data recorded.  PE:   Lab Results: Recent Labs    01/16/18 1158 01/16/18 2143 01/17/18 0913 01/17/18 1657 01/18/18 0050 01/18/18 0636  WBC 10.6*  --   --   --  11.4*  --   HGB 5.6* 5.4* 8.2* 8.1* 7.5* 7.7*  HCT 18.7* 18.2* 26.3* 26.9* 24.9* 24.9*  PLT 604*  --   --   --  490*  --    BMET Recent Labs    01/16/18 1158 01/17/18 0913 01/18/18 0050  NA 137 140 138  K 3.3* 3.5 3.2*  CL 105 108 109  CO2 21* 21* 23  CREATININE 0.62 0.59 0.64   LFT Recent Labs    01/16/18 1158  PROT 7.7  AST 20  ALT 8*  ALKPHOS 50  BILITOT 0.8   PT/INR No results for input(s): LABPROT, INR in the last 72 hours. PANCREAS Recent Labs    01/16/18 1158  LIPASE 26         Studies/Results: Dg Chest 2 View  Result Date: 01/17/2018 CLINICAL DATA:  Fever with nausea, vomiting and diarrhea since Saturday. EXAM: CHEST - 2 VIEW COMPARISON:  11/05/2012 FINDINGS: The heart size and mediastinal contours are within normal limits. Subtle opacity in the right middle lobe distribution may reflect a small focus of pneumonia or atelectasis. Streaky retrocardiac opacities likely vascular in etiology versus atelectasis and/or scarring is stable in appearance. IMPRESSION: Subtle opacity in the right middle lobe distribution suspicious for small  focus of pneumonia or atelectasis. Electronically Signed   By: Ashley Royalty M.D.   On: 01/17/2018 03:30   Ct Abdomen Pelvis W Contrast  Result Date: 01/16/2018 CLINICAL DATA:  Abdominal pain.  Anemia. EXAM: CT ABDOMEN AND PELVIS WITH CONTRAST TECHNIQUE: Multidetector CT imaging of the abdomen and pelvis was performed using the standard protocol following bolus administration of intravenous contrast. CONTRAST:  132mL ISOVUE-300 IOPAMIDOL (ISOVUE-300) INJECTION 61% COMPARISON:  None FINDINGS: Lower chest: No acute abnormality. Hepatobiliary: No focal liver abnormality is seen. No gallstones, gallbladder wall thickening, or biliary dilatation. Pancreas: Unremarkable. No pancreatic ductal dilatation or surrounding inflammatory changes. Spleen: Normal in size without focal abnormality. Adrenals/Urinary Tract: The adrenal glands appear normal. Unremarkable appearance of the kidneys. No mass or hydronephrosis. The urinary bladder appears normal. Stomach/Bowel: The stomach is normal. The small bowel loops have a normal course and caliber. No evidence for obstruction. The appendix is visualized and appears normal. No pathologic dilatation of the colon. Vascular/Lymphatic: Normal appearance of the abdominal aorta. No enlarged retroperitoneal or mesenteric adenopathy. No enlarged pelvic or inguinal lymph nodes. Reproductive: Markedly enlarged fibroid uterus is identified. The uterus measures 10.8 x 14.1 x 18.9 cm (volume = 1510 cm^3). Cyst in the left ovary measures 3.7 cm. Other: No free fluid or fluid collections. Musculoskeletal: Degenerative disc disease noted within the lower lumbar spine. IMPRESSION:  1. No acute findings within the abdomen or pelvis. 2. Markedly enlarged fibroid uterus with a volume of approximately 1500 cc. 3. Left ovary cyst. Electronically Signed   By: Kerby Moors M.D.   On: 01/16/2018 13:45   US Pelvic Complete With Transvaginal  Result Date: 01/17/2018 CLINICAL DATA:  Vaginal bleeding.  EXAM: TRANSABDOMINAL AND TRANSVAGINAL ULTRASOUND OF PELVIS TECHNIQUE: Both transabdominal and transvaginal ultrasound examinations of the pelvis were performed. Transabdominal technique was performed for global imaging of the pelvis including uterus, ovaries, adnexal regions, and pelvic cul-de-sac. It was necessary to proceed with endovaginal exam following the transabdominal exam to visualize the ovaries. COMPARISON:  CT abdomen and pelvis 01/16/2018 FINDINGS: Uterus Measurements: 17.3 x 11.6 x 13.3 cm. Multiple intramural masses compatible with fibroids as seen on CT including a 4 x 4 x 3 cm fibroid in the anterior fundus and 2 fibroids posteriorly in the mid uterine segment measuring 4.3 x 4.5 x 4.3 cm on the left and 6.3 x 6.8 x 4.5 cm on the right. Endometrium Thickness: 14 mm.  Endometrium distorted by the fibroids. Right ovary Measurements: 4.2 x 2.2 x 1.7 cm. Normal appearance/no adnexal mass. Left ovary Measurements: 4.4 x 3.1 x 4.1 cm. 4.1 x 2.0 x 1.6 cm benign appearing cyst. Other findings No abnormal free fluid. IMPRESSION: 1. Multiple uterine fibroids. 2. Benign appearing left ovarian cyst.  Unremarkable right ovary. Electronically Signed   By: Logan Bores M.D.   On: 01/17/2018 15:59    Medications: I have reviewed the patient's current medications.  Assessment:   1.  Esophagitis.  Patient had mild reflux esophagitis on endoscopy.  She states that she occasionally takes Tums. 2.  Profound iron deficiency anemia.  She has very heavy periods and this is likely a major contributing factor.  Her stools were guaiac negative here in the hospital. 3.  Massive uterine fibroids   Plan: 1.  I would go ahead and discharge her on Pepcid 20 mg twice daily for her mild reflux esophagitis.  Okay for her to take Tums as needed.  Would also send her out on oral iron.  I have asked her to see me back in the office in a month and we will follow-up on her anemia, guaiac her stools again and consider  colonoscopy.  She has my card and will call and make an appointment. 2.  Encourage GYN evaluation as outpatient.  I think the very heavy menses that she is having in her large uterine fibroids are the main cause of her iron deficiency anemia.  She may need to consider hysterectomy. We will sign off but please call us again if needed.   Nancy Fetter 01/18/2018, 8:20 AM  This note was created using voice recognition software. Minor errors may Have occurred unintentionally.  Pager: [phone] If no answer or after hours call [phone]

## 2018-01-18 NOTE — Progress Notes (Signed)
PROGRESS NOTE  NERIAH BROTT WFU:[id number] DOB: 05-Aug-1971 DOA: 01/16/2018 PCP: Patient, No Pcp Per  HPI/Recap of past 24 hours: ANYIA GIERKE is a 47 y.o. female with medical history significant of anemia, presented to [facility] ED for  Nausea, vomiting and abdominal pain since yesterday. Pt reports she ate chicken outside and sicne then has not been feeling good. Sister at bedside, reports that she has been anemic for a long time. She denies any fevers or chills at home. Abdominal pain is in the epigastric area, non radiating, associated with nausea, vomiting and diarrhea. Pt 's sister reports green watery stool. No sob, chest pain, URI symptoms, no sick contacts or similar complaints in the past.  On arrival to ED, she was found to be febrile, slightly tachycardic. Her hemoglobin was found to be around 5, stool for guiac negative. CT abd shows a fibroid uterus with a volume of 1500 ml.  She was referred to medical service for evaluation of symptomatic anemia.   01/17/2018: Patient seen and examined at her bedside.  She denies abdominal pain nausea or vomiting.  Reports she continues to have vaginal bleeding from her menses.  Spoke with gynecologist on call who is arranging for a outpatient follow-up.  Patient had EGD done this morning which was unremarkable.  01/18/18: No vaginal bleeding this morning or last night. Hg dropped to 7.5 this am from 8.1. Due to her hx of heavy menses, decision was made to transfuse her 2 units PRBCs today and repeat CBC in the am. Patient advised to f/u with gyn and GI post hospitalization.   Assessment/Plan: Active Problems:   Symptomatic anemia   Hypokalemia   Iron deficiency anemia due to chronic blood loss   Fever  Intractable nausea vomiting GI consulted and signed off 01/18/18 EGD done today 01/17/2018 revealed mild esophagitis most likely from retching Symptoms are improving Resume diet as tolerated IV Zofran as needed for nausea Start pepcid 20 mg  BID Tums as needed Follow up with GI outpatient  Mild reflux esophagitis Management as stated above GI recommends to continue pepcid 20 mg BID and tums prn  Severe iron deficiency anemia Hemoglobin of 5.4 on presentation CT abdomen and pelvis revealed large fibroids Gynecology consulted and recommended outpatient follow-up Started on IV ferrous supplement Received 2 units of PRBC Repeated hemoglobin 7.5 from 8.1  2 U prbcs ordered to be transfused in the setting of heavy menses and large uterine fibroids Repeat CBC in the morning  Large uterine fibroids Gynecology contacted and recommend outpatient follow-up Follow up with gyn outpatient  Heavy menses Monitor H&H and repeat CBC in the morning  Obesity BMI 31 Weight loss outpatient  Hypokalemia Repleted with p.o. Potassium Added magnesium supplement IV 2 grams  Code Status: Full code  Family Communication: None at bedside  Disposition Plan: Home when clinically stable    Consultants:  GI  Gynecology  Procedures:  EGD 01/17/2018  Antimicrobials:  None  DVT prophylaxis: SCDs   Objective: Vitals:   01/18/18 1035 01/18/18 1230 01/18/18 1312 01/18/18 1525  BP: 105/71 116/77 112/76 118/78  Pulse: 78 87 76 79  Resp: 16 18 14 16   Temp: 98.4 F (36.9 C) 98.2 F (36.8 C) 98.1 F (36.7 C) 99.2 F (37.3 C)  TempSrc: Oral Oral Oral Oral  SpO2: 100% 100% 100% 100%  Weight:      Height:        Intake/Output Summary (Last 24 hours) at 01/18/2018 1941 Last data filed at  01/18/2018 1800 Gross per 24 hour  Intake 1747.5 ml  Output -  Net 1747.5 ml   Filed Weights   01/16/18 1126 01/16/18 1758 01/17/18 1350  Weight: 68 kg (150 lb) 72.2 kg (159 lb 2.8 oz) 72.1 kg (159 lb)    Exam: 01/18/18   General: 46 yo AAF WD wN NAD A&O x3  Cardiovascular: RRR no rubs or gallops. NO JVD or thyromegaly  Respiratory: Clear to auscultation with no wheezes or rales.  Abdomen: Soft nontender nondistended with normal  bowel sounds x4 quadrant.  Musculoskeletal: No lower extremity edema.  Skin: No ulcerative lesion.  Psychiatry: Mood is appropriate for condition and setting.   Data Reviewed: CBC: Recent Labs  Lab 01/16/18 1158 01/16/18 2143 01/17/18 0913 01/17/18 1657 01/18/18 0050 01/18/18 0636  WBC 10.6*  --   --   --  11.4*  --   HGB 5.6* 5.4* 8.2* 8.1* 7.5* 7.7*  HCT 18.7* 18.2* 26.3* 26.9* 24.9* 24.9*  MCV 66.8*  --   --   --  71.1*  --   PLT 604*  --   --   --  490*  --    Basic Metabolic Panel: Recent Labs  Lab 01/16/18 1158 01/17/18 0913 01/18/18 0050  NA 137 140 138  K 3.3* 3.5 3.2*  CL 105 108 109  CO2 21* 21* 23  GLUCOSE 112* 98 124*  BUN 11 6 6   CREATININE 0.62 0.59 0.64  CALCIUM 8.3* 8.0* 8.0*   GFR: Estimated Creatinine Clearance: 76.2 mL/min (by C-G formula based on SCr of 0.64 mg/dL). Liver Function Tests: Recent Labs  Lab 01/16/18 1158  AST 20  ALT 8*  ALKPHOS 50  BILITOT 0.8  PROT 7.7  ALBUMIN 4.1   Recent Labs  Lab 01/16/18 1158  LIPASE 26   No results for input(s): AMMONIA in the last 168 hours. Coagulation Profile: No results for input(s): INR, PROTIME in the last 168 hours. Cardiac Enzymes: No results for input(s): CKTOTAL, CKMB, CKMBINDEX, TROPONINI in the last 168 hours. BNP (last 3 results) No results for input(s): PROBNP in the last 8760 hours. HbA1C: No results for input(s): HGBA1C in the last 72 hours. CBG: No results for input(s): GLUCAP in the last 168 hours. Lipid Profile: No results for input(s): CHOL, HDL, LDLCALC, TRIG, CHOLHDL, LDLDIRECT in the last 72 hours. Thyroid Function Tests: No results for input(s): TSH, T4TOTAL, FREET4, T3FREE, THYROIDAB in the last 72 hours. Anemia Panel: Recent Labs    01/16/18 1544  VITAMINB12 310  FOLATE 26.0  FERRITIN 3*  TIBC 468*  IRON 17*  RETICCTPCT 1.5   Urine analysis:    Component Value Date/Time   COLORURINE YELLOW 01/16/2018 [place] 01/16/2018 1206    LABSPEC >1.030 (H) 01/16/2018 1206   PHURINE 5.5 01/16/2018 1206   GLUCOSEU NEGATIVE 01/16/2018 1206   HGBUR NEGATIVE 01/16/2018 1206   BILIRUBINUR NEGATIVE 01/16/2018 1206   KETONESUR NEGATIVE 01/16/2018 1206   PROTEINUR NEGATIVE 01/16/2018 1206   UROBILINOGEN 0.2 04/17/2014 2139   NITRITE NEGATIVE 01/16/2018 1206   LEUKOCYTESUR NEGATIVE 01/16/2018 1206   Sepsis Labs: @LABRCNTIP (procalcitonin:4,lacticidven:4)  ) Recent Results (from the past 240 hour(s))  Culture, blood (Routine X 2) w Reflex to ID Panel     Status: None (Preliminary result)   Collection Time: 01/16/18  6:00 PM  Result Value Ref Range Status   Specimen Description BLOOD LEFT HAND  Final   Special Requests   Final    BOTTLES DRAWN AEROBIC ONLY  Blood Culture adequate volume   Culture   Final    NO GROWTH 1 DAY Performed at [facility] [address], [place], [place] [address]    Report Status PENDING  Incomplete  Culture, blood (Routine X 2) w Reflex to ID Panel     Status: None (Preliminary result)   Collection Time: 01/16/18  6:09 PM  Result Value Ref Range Status   Specimen Description BLOOD RIGHT ARM  Final   Special Requests   Final    BOTTLES DRAWN AEROBIC AND ANAEROBIC Blood Culture adequate volume   Culture   Final    NO GROWTH 1 DAY Performed at [facility], [place] [address] [place], [place] [address]    Report Status PENDING  Incomplete      Studies: No results found.  Scheduled Meds: . famotidine  20 mg Oral BID  . pantoprazole (PROTONIX) IV  40 mg Intravenous Q24H  . potassium chloride  40 mEq Oral BID    Continuous Infusions:   LOS: 2 days     Kayleen Memos, MD [facility] Pager [phone]  If 7PM-7AM, please contact night-coverage [email or web address] Password [facility] 01/18/2018, 7:41 PM

## 2018-01-19 DIAGNOSIS — D500 Iron deficiency anemia secondary to blood loss (chronic): Secondary | ICD-10-CM

## 2018-01-19 LAB — TYPE AND SCREEN
ABO/RH(D): O POS
Antibody Screen: POSITIVE
Donor AG Type: NEGATIVE
Donor AG Type: NEGATIVE
Donor AG Type: NEGATIVE
Donor AG Type: NEGATIVE
PT AG Type: NEGATIVE
Unit division: 0
Unit division: 0
Unit division: 0
Unit division: 0

## 2018-01-19 LAB — BPAM RBC
Blood Product Expiration Date: 201904252359
Blood Product Expiration Date: 201904292359
Blood Product Expiration Date: 201904292359
Blood Product Expiration Date: 201905012359
ISSUE DATE / TIME: 201904010000
ISSUE DATE / TIME: 201904010423
ISSUE DATE / TIME: 201904021013
ISSUE DATE / TIME: 201904021251
Unit Type and Rh: 5100
Unit Type and Rh: 5100
Unit Type and Rh: 5100
Unit Type and Rh: 5100

## 2018-01-19 LAB — CBC
HCT: 34.3 % — ABNORMAL LOW (ref 36.0–46.0)
Hemoglobin: 10.9 g/dL — ABNORMAL LOW (ref 12.0–15.0)
MCH: 23.9 pg — ABNORMAL LOW (ref 26.0–34.0)
MCHC: 31.8 g/dL (ref 30.0–36.0)
MCV: 75.2 fL — ABNORMAL LOW (ref 78.0–100.0)
PLATELETS: 432 10*3/uL — AB (ref 150–400)
RBC: 4.56 MIL/uL (ref 3.87–5.11)
RDW: 26.9 % — AB (ref 11.5–15.5)
WBC: 13 10*3/uL — AB (ref 4.0–10.5)

## 2018-01-19 MED ORDER — FAMOTIDINE 20 MG PO TABS: 20.0000 mg | ORAL_TABLET | Freq: Two times a day (BID) | ORAL | 0 refills | Status: AC

## 2018-01-19 MED ORDER — FERROUS SULFATE 325 (65 FE) MG PO TBEC: DELAYED_RELEASE_TABLET | ORAL | 1 refills | Status: DC

## 2018-01-19 MED ORDER — DOCUSATE SODIUM 100 MG PO CAPS: 100.0000 mg | ORAL_CAPSULE | Freq: Two times a day (BID) | ORAL | 2 refills | Status: AC

## 2018-01-19 NOTE — Discharge Summary (Signed)
[facility]  Physician Discharge Summary   Patient ID: CRISSA SOWDER MRN: [id number] DOB/AGE: 1971-01-29 47 y.o.  Admit date: 01/16/2018 Discharge date: 01/19/2018  PCP: Patient, No Pcp Per  DISCHARGE DIAGNOSES:  Active Problems:   Symptomatic anemia   Hypokalemia   Iron deficiency anemia due to chronic blood loss   Fever   RECOMMENDATIONS FOR OUTPATIENT FOLLOW UP: 1. Patient has to follow-up with GYN for further management of uterine fibroids   DISCHARGE CONDITION: fair  Diet recommendation: As before  [facility] Weights   01/16/18 1126 01/16/18 1758 01/17/18 1350  Weight: 68 kg (150 lb) 72.2 kg (159 lb 2.8 oz) 72.1 kg (159 lb)    INITIAL HISTORY: CHANIKA BYLAND a 47 y.o.femalewith medical history significant ofanemia, presented to [facility] ED for Nausea, vomiting and abdominal pain. Abdominal pain is in the epigastric area, non radiating, associated with nausea, vomiting and diarrhea. On arrival to ED, she was found to be febrile, slightly tachycardic. Her hemoglobin was found to be around 5, stool for guiac negative. CT abd shows a fibroid uterus with a volume of 1500 ml. She was referred to medical service for evaluation of symptomatic anemia.   Consultations:  Phone discussion with GYN  [facility]  Procedures: EGD Impression:                - Non-severe reflux esophagitis.  - Normal stomach.  - Normal examined duodenum. - No specimens collected.    HOSPITAL COURSE:   Intractable nausea vomiting Gastroenterology was consulted. EGD 01/17/2018 revealed mild esophagitis most likely from retching.  Symptoms started improving.  She tolerated diet.  GI recommends Pepcid 20 mg twice a day.  They have provided instruction to the patient for follow-up and to consider colonoscopy in the future.  Mild reflux esophagitis GI recommends to continue pepcid 20 mg BID and tums prn  Severe iron deficiency anemia Anemia most likely due to uterine fibroids  causing menorrhagia.  Hemoglobin of 5.4 on presentation. CT abdomen and pelvis revealed large fibroids.  GYN recommended outpatient follow-up.  Patient provided with contact for the [facility] clinic.  She has been also told to call her insurance for provider information.  Patient's hemoglobin improved with 4 units of blood transfusion.  Symptomatically she feels better.  She will be discharged on oral iron tablets.  It appears that she was also given intravenous iron supplementation.  Large uterine fibroids/menorrhagia Gynecology contacted and recommend outpatient follow-up.  Does not have any bleeding currently.  Obesity BMI 31 Weight loss outpatient  Hypokalemia Repleted with p.o. Potassium   Overall stable.  Okay for discharge home today.     PERTINENT LABS:  The results of significant diagnostics from this hospitalization (including imaging, microbiology, ancillary and laboratory) are listed below for reference.    Microbiology: Recent Results (from the past 240 hour(s))  Culture, blood (Routine X 2) w Reflex to ID Panel     Status: None (Preliminary result)   Collection Time: 01/16/18  6:00 PM  Result Value Ref Range Status   Specimen Description BLOOD LEFT HAND  Final   Special Requests   Final    BOTTLES DRAWN AEROBIC ONLY Blood Culture adequate volume   Culture   Final    NO GROWTH 2 DAYS Performed at [facility], [address] [place], [place] [address]    Report Status PENDING  Incomplete  Culture, blood (Routine X 2) w Reflex to ID Panel     Status: None (Preliminary  result)   Collection Time: 01/16/18  6:09 PM  Result Value Ref Range Status   Specimen Description BLOOD RIGHT ARM  Final   Special Requests   Final    BOTTLES DRAWN AEROBIC AND ANAEROBIC Blood Culture adequate volume   Culture   Final    NO GROWTH 2 DAYS Performed at [facility], [address] [place], [place] [address]    Report Status PENDING  Incomplete      Labs: Basic Metabolic Panel: Recent Labs  Lab 01/16/18 1158 01/17/18 0913 01/18/18 0050  NA 137 140 138  K 3.3* 3.5 3.2*  CL 105 108 109  CO2 21* 21* 23  GLUCOSE 112* 98 124*  BUN 11 6 6   CREATININE 0.62 0.59 0.64  CALCIUM 8.3* 8.0* 8.0*   Liver Function Tests: Recent Labs  Lab 01/16/18 1158  AST 20  ALT 8*  ALKPHOS 50  BILITOT 0.8  PROT 7.7  ALBUMIN 4.1   Recent Labs  Lab 01/16/18 1158  LIPASE 26   CBC: Recent Labs  Lab 01/16/18 1158  01/18/18 0050 01/18/18 0636 01/18/18 1751 01/18/18 2157 01/19/18 0534  WBC 10.6*  --  11.4*  --   --   --  13.0*  HGB 5.6*   < > 7.5* 7.7* 11.3* 10.8* 10.9*  HCT 18.7*   < > 24.9* 24.9* 35.6* 35.1* 34.3*  MCV 66.8*  --  71.1*  --   --   --  75.2*  PLT 604*  --  490*  --   --   --  432*   < > = values in this interval not displayed.     IMAGING STUDIES Dg Chest 2 View  Result Date: 01/17/2018 CLINICAL DATA:  Fever with nausea, vomiting and diarrhea since Saturday. EXAM: CHEST - 2 VIEW COMPARISON:  11/05/2012 FINDINGS: The heart size and mediastinal contours are within normal limits. Subtle opacity in the right middle lobe distribution may reflect a small focus of pneumonia or atelectasis. Streaky retrocardiac opacities likely vascular in etiology versus atelectasis and/or scarring is stable in appearance. IMPRESSION: Subtle opacity in the right middle lobe distribution suspicious for small focus of pneumonia or atelectasis. Electronically Signed   By: Ashley Royalty M.D.   On: 01/17/2018 03:30   Ct Abdomen Pelvis W Contrast  Result Date: 01/16/2018 CLINICAL DATA:  Abdominal pain.  Anemia. EXAM: CT ABDOMEN AND PELVIS WITH CONTRAST TECHNIQUE: Multidetector CT imaging of the abdomen and pelvis was performed using the standard protocol following bolus administration of intravenous contrast. CONTRAST:  16mL ISOVUE-300 IOPAMIDOL (ISOVUE-300) INJECTION 61% COMPARISON:  None FINDINGS: Lower chest: No acute abnormality. Hepatobiliary: No  focal liver abnormality is seen. No gallstones, gallbladder wall thickening, or biliary dilatation. Pancreas: Unremarkable. No pancreatic ductal dilatation or surrounding inflammatory changes. Spleen: Normal in size without focal abnormality. Adrenals/Urinary Tract: The adrenal glands appear normal. Unremarkable appearance of the kidneys. No mass or hydronephrosis. The urinary bladder appears normal. Stomach/Bowel: The stomach is normal. The small bowel loops have a normal course and caliber. No evidence for obstruction. The appendix is visualized and appears normal. No pathologic dilatation of the colon. Vascular/Lymphatic: Normal appearance of the abdominal aorta. No enlarged retroperitoneal or mesenteric adenopathy. No enlarged pelvic or inguinal lymph nodes. Reproductive: Markedly enlarged fibroid uterus is identified. The uterus measures 10.8 x 14.1 x 18.9 cm (volume = 1510 cm^3). Cyst in the left ovary measures 3.7 cm. Other: No free fluid or fluid collections. Musculoskeletal: Degenerative disc disease noted within the  lower lumbar spine. IMPRESSION: 1. No acute findings within the abdomen or pelvis. 2. Markedly enlarged fibroid uterus with a volume of approximately 1500 cc. 3. Left ovary cyst. Electronically Signed   By: Kerby Moors M.D.   On: 01/16/2018 13:45   US Pelvic Complete With Transvaginal  Result Date: 01/17/2018 CLINICAL DATA:  Vaginal bleeding. EXAM: TRANSABDOMINAL AND TRANSVAGINAL ULTRASOUND OF PELVIS TECHNIQUE: Both transabdominal and transvaginal ultrasound examinations of the pelvis were performed. Transabdominal technique was performed for global imaging of the pelvis including uterus, ovaries, adnexal regions, and pelvic cul-de-sac. It was necessary to proceed with endovaginal exam following the transabdominal exam to visualize the ovaries. COMPARISON:  CT abdomen and pelvis 01/16/2018 FINDINGS: Uterus Measurements: 17.3 x 11.6 x 13.3 cm. Multiple intramural masses compatible with  fibroids as seen on CT including a 4 x 4 x 3 cm fibroid in the anterior fundus and 2 fibroids posteriorly in the mid uterine segment measuring 4.3 x 4.5 x 4.3 cm on the left and 6.3 x 6.8 x 4.5 cm on the right. Endometrium Thickness: 14 mm.  Endometrium distorted by the fibroids. Right ovary Measurements: 4.2 x 2.2 x 1.7 cm. Normal appearance/no adnexal mass. Left ovary Measurements: 4.4 x 3.1 x 4.1 cm. 4.1 x 2.0 x 1.6 cm benign appearing cyst. Other findings No abnormal free fluid. IMPRESSION: 1. Multiple uterine fibroids. 2. Benign appearing left ovarian cyst.  Unremarkable right ovary. Electronically Signed   By: Logan Bores M.D.   On: 01/17/2018 15:59    DISCHARGE EXAMINATION: Vitals:   01/18/18 1312 01/18/18 1525 01/18/18 2117 01/19/18 0458  BP: 112/76 118/78 (!) 119/95 (!) 103/58  Pulse: 76 79 83 71  Resp: 14 16 18 18   Temp: 98.1 F (36.7 C) 99.2 F (37.3 C) 99.2 F (37.3 C) 98.1 F (36.7 C)  TempSrc: Oral Oral Oral Oral  SpO2: 100% 100% 100% 97%  Weight:      Height:       General appearance: alert, cooperative, appears stated age and no distress Resp: clear to auscultation bilaterally Cardio: regular rate and rhythm, S1, S2 normal, no murmur, click, rub or gallop GI: Abdomen is soft.  Fullness appreciated in the suprapubic area.  No tenderness. Extremities: extremities normal, atraumatic, no cyanosis or edema  DISPOSITION: Home  Discharge Instructions    Call MD for:  extreme fatigue   Complete by:  As directed    Call MD for:  persistant dizziness or light-headedness   Complete by:  As directed    Call MD for:  persistant nausea and vomiting   Complete by:  As directed    Call MD for:  severe uncontrolled pain   Complete by:  As directed    Call MD for:  temperature >100.4   Complete by:  As directed    Diet - low sodium heart healthy   Complete by:  As directed    Discharge instructions   Complete by:  As directed    Please be sure to follow-up with gynecologist  for your uterine fibroids.  You were cared for by a hospitalist during your hospital stay. If you have any questions about your discharge medications or the care you received while you were in the hospital after you are discharged, you can call the unit and asked to speak with the hospitalist on call if the hospitalist that took care of you is not available. Once you are discharged, your primary care physician will handle any further medical issues. Please note that  NO REFILLS for any discharge medications will be authorized once you are discharged, as it is imperative that you return to your primary care physician (or establish a relationship with a primary care physician if you do not have one) for your aftercare needs so that they can reassess your need for medications and monitor your lab values. If you do not have a primary care physician, you can call [phone] for a physician referral.   Increase activity slowly   Complete by:  As directed         Allergies as of 01/19/2018   No Known Allergies     Medication List    STOP taking these medications   naproxen sodium 220 MG tablet Commonly known as:  ALEVE     TAKE these medications   docusate sodium 100 MG capsule Commonly known as:  COLACE Take 1 capsule (100 mg total) by mouth 2 (two) times daily.   famotidine 20 MG tablet Commonly known as:  PEPCID Take 1 tablet (20 mg total) by mouth 2 (two) times daily.   ferrous sulfate 325 (65 FE) MG EC tablet Take 1 tablet once daily for 1 week and then twice daily for 1 week and then three times daily        Follow-up Information    [facility] up.   Why:  call for appt to discuss uterine fibroids Contact information: [address]          TOTAL DISCHARGE TIME: 35 minutes  Bonnielee Haff  [facility] Pager [phone]  01/19/2018, 4:17 PM

## 2018-01-19 NOTE — Discharge Instructions (Signed)
Uterine Fibroids °Uterine fibroids are tissue masses (tumors). They are also called leiomyomas. They can develop inside of a woman’s womb (uterus). They can grow very large. Fibroids are not cancerous (benign). Most fibroids do not require medical treatment. °Follow these instructions at home: °· Keep all follow-up visits as told by your doctor. This is important. °· Take medicines only as told by your doctor. °? If you were prescribed a hormone treatment, take the hormone medicines exactly as told. °? Do not take aspirin. It can cause bleeding. °· Ask your doctor about taking iron pills and increasing the amount of dark green, leafy vegetables in your diet. These actions can help to boost your blood iron levels. °· Pay close attention to your period. Tell your doctor about any changes, such as: °? Increased blood flow. This may require you to use more pads or tampons than usual per month. °? A change in the number of days that your period lasts per month. °? A change in symptoms that come with your period, such as back pain or cramping in your belly area (abdomen). °Contact a doctor if: °· You have pain in your back or the area between your hip bones (pelvic area) that is not controlled by medicines. °· You have pain in your abdomen that is not controlled with medicines. °· You have an increase in bleeding between and during periods. °· You soak tampons or pads in a half hour or less. °· You feel lightheaded. °· You feel extra tired. °· You feel weak. °Get help right away if: °· You pass out (faint). °· You have a sudden increase in pelvic pain. °This information is not intended to replace advice given to you by your health care provider. Make sure you discuss any questions you have with your health care provider. °Document Released: 11/07/2010 Document Revised: 06/05/2016 Document Reviewed: 04/03/2014 °Elsevier Interactive Patient Education © 2018 [employer] °Iron Deficiency Anemia, Adult °Iron-deficiency  anemia is when you have a low amount of red blood cells or hemoglobin. This happens because you have too little iron in your body. Hemoglobin carries oxygen to parts of the body. Anemia can cause your body to not get enough oxygen. It may or may not cause symptoms. °Follow these instructions at home: °Medicines °· Take over-the-counter and prescription medicines only as told by your doctor. This includes iron pills (supplements) and vitamins. °· If you cannot handle taking iron pills by mouth, ask your doctor about getting iron through: °? A vein (intravenously). °? A shot (injection) into a muscle. °· Take iron pills when your stomach is empty. If you cannot handle this, take them with food. °· Do not drink milk or take antacids at the same time as your iron pills. °· To prevent trouble pooping (constipation), eat fiber or take medicine (stool softener) as told by your doctor. °Eating and drinking °· Talk with your doctor before changing the foods you eat. He or she may tell you to eat foods that have a lot of iron, such as: °? Liver. °? Lowfat (lean) beef. °? Breads and cereals that have iron added to them (fortified breads and cereals). °? Eggs. °? Dried fruit. °? Dark green, leafy vegetables. °· Drink enough fluid to keep your pee (urine) clear or pale yellow. °· Eat fresh fruits and vegetables that are high in vitamin C. They help your body to use iron. Foods with a lot of vitamin C include: °? Oranges. °? Peppers. °? Tomatoes. °? Mangoes. °General instructions °·   Return to your normal activities as told by your doctor. Ask your doctor what activities are safe for you. °· Keep yourself clean, and keep things clean around you (your surroundings). Anemia can make you get sick more easily. °· Keep all follow-up visits as told by your doctor. This is important. °Contact a doctor if: °· You feel sick to your stomach (nauseous). °· You throw up (vomit). °· You feel weak. °· You are sweating for no clear  reason. °· You have trouble pooping, such as: °? Pooping (having a bowel movement) less than 3 times a week. °? Straining to poop. °? Having poop that is hard, dry, or larger than normal. °? Feeling full or bloated. °? Pain in the lower belly. °? Not feeling better after pooping. °Get help right away if: °· You pass out (faint). If this happens, do not drive yourself to the hospital. Call your local emergency services (911 in the [place] °· You have chest pain. °· You have shortness of breath that: °? Is very bad. °? Gets worse with physical activity. °· You have a fast heartbeat. °· You get light-headed when getting up from sitting or lying down. °This information is not intended to replace advice given to you by your health care provider. Make sure you discuss any questions you have with your health care provider. °Document Released: 11/07/2010 Document Revised: 06/24/2016 Document Reviewed: 06/24/2016 °Elsevier Interactive Patient Education © 2017 Elsevier Inc. ° °

## 2018-01-22 LAB — CULTURE, BLOOD (ROUTINE X 2)
Culture: NO GROWTH
Culture: NO GROWTH
SPECIAL REQUESTS: ADEQUATE
Special Requests: ADEQUATE

## 2018-01-31 ENCOUNTER — Encounter: Payer: Self-pay | Admitting: Obstetrics and Gynecology

## 2018-02-28 ENCOUNTER — Encounter: Payer: Self-pay | Admitting: Obstetrics and Gynecology

## 2018-02-28 NOTE — Progress Notes (Signed)
Patient did not keep GYN referral appointment for 02/28/2018.  Durene Romans MD Attending Center for [employer])

## 2018-04-11 NOTE — Patient Instructions (Addendum)
Your procedure is scheduled on: Wednesday, July 10  Enter through the [facility] at: 7 am  Pick up the phone at the desk and dial [phone].  Call this number if you have problems the morning of surgery: [phone].  Remember: Do NOT eat food or Do NOT drink clear liquids (including water) after midnight Tuesday.  Take these medicines the morning of surgery with a SIP OF WATER: pepcid  Stop herbal medications, vitamin supplements and Ibuprofen/NSAIDS at this time.  Do NOT wear jewelry (body piercing), metal hair clips/bobby pins, make-up, or nail polish. Do NOT wear lotions, powders, or perfumes.  You may wear deoderant. Do NOT shave for 48 hours prior to surgery. Do NOT bring valuables to the hospital.   Leave suitcase in car.  After surgery it may be brought to your room.  For patients admitted to the hospital, checkout time is 11:00 AM the day of discharge. Home with to be arranged by prior to surgery.  Patient informed that she can not go home by bus, [place] or taxi.  Must be 33yr or older to drive patient home.

## 2018-04-20 ENCOUNTER — Encounter (HOSPITAL_COMMUNITY): Payer: Self-pay

## 2018-04-20 ENCOUNTER — Other Ambulatory Visit: Payer: Self-pay

## 2018-04-20 ENCOUNTER — Encounter (HOSPITAL_COMMUNITY)
Admission: RE | Admit: 2018-04-20 | Discharge: 2018-04-20 | Disposition: A | Discharge status: Home / Self Care | Payer: BLUE CROSS/BLUE SHIELD | Source: Ambulatory Visit | Attending: Obstetrics | Admitting: Obstetrics

## 2018-04-20 DIAGNOSIS — Z01812 Encounter for preprocedural laboratory examination: Secondary | ICD-10-CM | POA: Insufficient documentation

## 2018-04-20 HISTORY — DX: Personal history of other medical treatment: Z92.89

## 2018-04-20 HISTORY — DX: Encounter for elective termination of pregnancy: Z33.2

## 2018-04-20 HISTORY — DX: Anemia, unspecified: D64.9

## 2018-04-20 LAB — CBC
HCT: 39 % (ref 36.0–46.0)
HEMOGLOBIN: 13.2 g/dL (ref 12.0–15.0)
MCH: 28.8 pg (ref 26.0–34.0)
MCHC: 33.8 g/dL (ref 30.0–36.0)
MCV: 85 fL (ref 78.0–100.0)
PLATELETS: 202 10*3/uL (ref 150–400)
RBC: 4.59 MIL/uL (ref 3.87–5.11)
RDW: 14.1 % (ref 11.5–15.5)
WBC: 7.8 10*3/uL (ref 4.0–10.5)

## 2018-04-20 NOTE — Pre-Procedure Instructions (Addendum)
SDS BB History Log given to lab for patient's previous history of a blood transfusion 01/16/18 at [facility].  Patient not eligible for 14 Day T&S.  Need to redraw T&S on DOS.  Stat order placed in computer for DOS.Marland Kitchen

## 2018-04-22 ENCOUNTER — Encounter (HOSPITAL_COMMUNITY): Payer: Self-pay

## 2018-04-25 LAB — TYPE AND SCREEN
ABO/RH(D): O POS
ANTIBODY SCREEN: POSITIVE
DONOR AG TYPE: NEGATIVE
Donor AG Type: NEGATIVE
PT AG TYPE: NEGATIVE
Unit division: 0
Unit division: 0

## 2018-04-25 LAB — BPAM RBC
BLOOD PRODUCT EXPIRATION DATE: 201907252359
BLOOD PRODUCT EXPIRATION DATE: 201908072359
ISSUE DATE / TIME: 201906251406
UNIT TYPE AND RH: 5100
UNIT TYPE AND RH: 5100

## 2018-04-27 ENCOUNTER — Inpatient Hospital Stay (HOSPITAL_COMMUNITY): Payer: BLUE CROSS/BLUE SHIELD | Admitting: Anesthesiology

## 2018-04-27 ENCOUNTER — Encounter (HOSPITAL_COMMUNITY): Payer: Self-pay

## 2018-04-27 ENCOUNTER — Inpatient Hospital Stay (HOSPITAL_COMMUNITY)
Admission: AD | Admit: 2018-04-27 | Discharge: 2018-04-29 | DRG: 743 | Disposition: A | Discharge status: Home / Self Care | Payer: BLUE CROSS/BLUE SHIELD | Source: Ambulatory Visit | Attending: Obstetrics | Admitting: Obstetrics

## 2018-04-27 ENCOUNTER — Encounter (HOSPITAL_COMMUNITY)
Admission: AD | Disposition: A | Discharge status: Home / Self Care | Payer: Self-pay | Source: Ambulatory Visit | Attending: Obstetrics

## 2018-04-27 ENCOUNTER — Other Ambulatory Visit: Payer: Self-pay

## 2018-04-27 DIAGNOSIS — D250 Submucous leiomyoma of uterus: Secondary | ICD-10-CM

## 2018-04-27 DIAGNOSIS — D259 Leiomyoma of uterus, unspecified: Secondary | ICD-10-CM | POA: Diagnosis present

## 2018-04-27 DIAGNOSIS — N83202 Unspecified ovarian cyst, left side: Secondary | ICD-10-CM | POA: Diagnosis present

## 2018-04-27 DIAGNOSIS — D251 Intramural leiomyoma of uterus: Secondary | ICD-10-CM

## 2018-04-27 DIAGNOSIS — D252 Subserosal leiomyoma of uterus: Secondary | ICD-10-CM

## 2018-04-27 HISTORY — PX: BILATERAL SALPINGECTOMY: SHX5743

## 2018-04-27 HISTORY — PX: ABDOMINAL HYSTERECTOMY: SHX81

## 2018-04-27 LAB — CBC
HEMATOCRIT: 31.9 % — AB (ref 36.0–46.0)
HEMOGLOBIN: 10.9 g/dL — AB (ref 12.0–15.0)
MCH: 29.1 pg (ref 26.0–34.0)
MCHC: 34.2 g/dL (ref 30.0–36.0)
MCV: 85.3 fL (ref 78.0–100.0)
Platelets: 192 10*3/uL (ref 150–400)
RBC: 3.74 MIL/uL — ABNORMAL LOW (ref 3.87–5.11)
RDW: 14.2 % (ref 11.5–15.5)
WBC: 13.4 10*3/uL — AB (ref 4.0–10.5)

## 2018-04-27 LAB — PREGNANCY, URINE: Preg Test, Ur: NEGATIVE

## 2018-04-27 SURGERY — HYSTERECTOMY, ABDOMINAL
Anesthesia: General

## 2018-04-27 MED ORDER — CEFAZOLIN SODIUM-DEXTROSE 2-4 GM/100ML-% IV SOLN
2.0000 g | INTRAVENOUS | Status: AC
  Administered 2018-04-27: 2 g via INTRAVENOUS

## 2018-04-27 MED ORDER — DEXAMETHASONE SODIUM PHOSPHATE 4 MG/ML IJ SOLN
INTRAMUSCULAR | Status: AC
Start: 2018-04-27 — End: ?
  Filled 2018-04-27: qty 1

## 2018-04-27 MED ORDER — FENTANYL CITRATE (PF) 100 MCG/2ML IJ SOLN
INTRAMUSCULAR | Status: DC | PRN
  Administered 2018-04-27 (×3): 50 ug via INTRAVENOUS
  Administered 2018-04-27: 100 ug via INTRAVENOUS

## 2018-04-27 MED ORDER — MENTHOL 3 MG MT LOZG: 1.0000 | LOZENGE | OROMUCOSAL | Status: DC | PRN

## 2018-04-27 MED ORDER — LACTATED RINGERS IV SOLN
INTRAVENOUS | Status: DC
  Administered 2018-04-27: 14:00:00 via INTRAVENOUS

## 2018-04-27 MED ORDER — CELECOXIB 200 MG PO CAPS
ORAL_CAPSULE | ORAL | Status: AC
  Administered 2018-04-27: 400 mg via ORAL
  Filled 2018-04-27: qty 2

## 2018-04-27 MED ORDER — DOCUSATE SODIUM 100 MG PO CAPS
100.0000 mg | ORAL_CAPSULE | Freq: Two times a day (BID) | ORAL | Status: DC
  Administered 2018-04-27 – 2018-04-28 (×4): 100 mg via ORAL
  Filled 2018-04-27 (×4): qty 1

## 2018-04-27 MED ORDER — PROPOFOL 10 MG/ML IV BOLUS
INTRAVENOUS | Status: AC
  Filled 2018-04-27: qty 20

## 2018-04-27 MED ORDER — ACETAMINOPHEN 500 MG PO TABS
ORAL_TABLET | ORAL | Status: AC
  Administered 2018-04-27: 1000 mg via ORAL
  Filled 2018-04-27: qty 2

## 2018-04-27 MED ORDER — HYDROMORPHONE HCL 1 MG/ML IJ SOLN
0.2500 mg | INTRAMUSCULAR | Status: DC | PRN
  Administered 2018-04-27 (×2): 0.25 mg via INTRAVENOUS

## 2018-04-27 MED ORDER — ONDANSETRON HCL 4 MG PO TABS: 4.0000 mg | ORAL_TABLET | Freq: Four times a day (QID) | ORAL | Status: DC | PRN

## 2018-04-27 MED ORDER — ACETAMINOPHEN 325 MG PO TABS
650.0000 mg | ORAL_TABLET | Freq: Four times a day (QID) | ORAL | Status: DC
  Administered 2018-04-27 – 2018-04-28 (×4): 650 mg via ORAL
  Filled 2018-04-27 (×6): qty 2

## 2018-04-27 MED ORDER — LIDOCAINE HCL (CARDIAC) PF 100 MG/5ML IV SOSY
PREFILLED_SYRINGE | INTRAVENOUS | Status: DC | PRN
  Administered 2018-04-27: 50 mg via INTRAVENOUS

## 2018-04-27 MED ORDER — KETOROLAC TROMETHAMINE 30 MG/ML IJ SOLN
INTRAMUSCULAR | Status: AC
  Filled 2018-04-27: qty 1

## 2018-04-27 MED ORDER — LIDOCAINE HCL (PF) 1 % IJ SOLN
INTRAMUSCULAR | Status: AC
  Filled 2018-04-27: qty 5

## 2018-04-27 MED ORDER — MIDAZOLAM HCL 2 MG/2ML IJ SOLN
INTRAMUSCULAR | Status: AC
  Filled 2018-04-27: qty 2

## 2018-04-27 MED ORDER — PROPOFOL 10 MG/ML IV BOLUS
INTRAVENOUS | Status: DC | PRN
  Administered 2018-04-27: 150 mg via INTRAVENOUS

## 2018-04-27 MED ORDER — ACETAMINOPHEN 500 MG PO TABS
1000.0000 mg | ORAL_TABLET | ORAL | Status: AC
  Administered 2018-04-27: 1000 mg via ORAL

## 2018-04-27 MED ORDER — PROMETHAZINE HCL 25 MG/ML IJ SOLN: 6.2500 mg | INTRAMUSCULAR | Status: DC | PRN

## 2018-04-27 MED ORDER — ONDANSETRON HCL 4 MG/2ML IJ SOLN
INTRAMUSCULAR | Status: AC
  Filled 2018-04-27: qty 2

## 2018-04-27 MED ORDER — HYDROMORPHONE HCL 1 MG/ML IJ SOLN
0.2000 mg | INTRAMUSCULAR | Status: DC | PRN
  Administered 2018-04-27: 0.6 mg via INTRAVENOUS
  Filled 2018-04-27 (×2): qty 1

## 2018-04-27 MED ORDER — ALUM & MAG HYDROXIDE-SIMETH 200-200-20 MG/5ML PO SUSP: 30.0000 mL | ORAL | Status: DC | PRN

## 2018-04-27 MED ORDER — LACTATED RINGERS IV SOLN
INTRAVENOUS | Status: DC
  Administered 2018-04-27 (×3): via INTRAVENOUS

## 2018-04-27 MED ORDER — SUGAMMADEX SODIUM 200 MG/2ML IV SOLN
INTRAVENOUS | Status: AC
  Filled 2018-04-27: qty 2

## 2018-04-27 MED ORDER — GABAPENTIN 300 MG PO CAPS
ORAL_CAPSULE | ORAL | Status: AC
  Administered 2018-04-27: 300 mg via ORAL
  Filled 2018-04-27: qty 1

## 2018-04-27 MED ORDER — SCOPOLAMINE 1 MG/3DAYS TD PT72
1.0000 | MEDICATED_PATCH | Freq: Once | TRANSDERMAL | Status: DC
  Administered 2018-04-27: 1.5 mg via TRANSDERMAL

## 2018-04-27 MED ORDER — IBUPROFEN 600 MG PO TABS
600.0000 mg | ORAL_TABLET | Freq: Four times a day (QID) | ORAL | Status: DC
  Administered 2018-04-27 – 2018-04-29 (×6): 600 mg via ORAL
  Filled 2018-04-27 (×6): qty 1

## 2018-04-27 MED ORDER — SUGAMMADEX SODIUM 200 MG/2ML IV SOLN
INTRAVENOUS | Status: DC | PRN
  Administered 2018-04-27: 140.6 mg via INTRAVENOUS

## 2018-04-27 MED ORDER — SIMETHICONE 80 MG PO CHEW: 80.0000 mg | CHEWABLE_TABLET | Freq: Four times a day (QID) | ORAL | Status: DC | PRN

## 2018-04-27 MED ORDER — KETOROLAC TROMETHAMINE 30 MG/ML IJ SOLN
INTRAMUSCULAR | Status: DC | PRN
  Administered 2018-04-27: 30 mg via INTRAVENOUS

## 2018-04-27 MED ORDER — MIDAZOLAM HCL 2 MG/2ML IJ SOLN
INTRAMUSCULAR | Status: DC | PRN
  Administered 2018-04-27: 2 mg via INTRAVENOUS

## 2018-04-27 MED ORDER — HYDROMORPHONE HCL 1 MG/ML IJ SOLN
INTRAMUSCULAR | Status: AC
  Administered 2018-04-27: 0.25 mg via INTRAVENOUS
  Filled 2018-04-27: qty 0.5

## 2018-04-27 MED ORDER — ONDANSETRON HCL 4 MG/2ML IJ SOLN
INTRAMUSCULAR | Status: DC | PRN
  Administered 2018-04-27: 4 mg via INTRAVENOUS

## 2018-04-27 MED ORDER — OXYCODONE HCL 5 MG PO TABS
5.0000 mg | ORAL_TABLET | Freq: Four times a day (QID) | ORAL | Status: DC | PRN
  Administered 2018-04-27: 5 mg via ORAL
  Filled 2018-04-27: qty 1

## 2018-04-27 MED ORDER — DEXAMETHASONE SODIUM PHOSPHATE 4 MG/ML IJ SOLN
INTRAMUSCULAR | Status: AC
  Filled 2018-04-27: qty 1

## 2018-04-27 MED ORDER — ONDANSETRON HCL 4 MG/2ML IJ SOLN: 4.0000 mg | Freq: Four times a day (QID) | INTRAMUSCULAR | Status: DC | PRN

## 2018-04-27 MED ORDER — HYDROMORPHONE HCL 1 MG/ML IJ SOLN
INTRAMUSCULAR | Status: AC
  Filled 2018-04-27: qty 1

## 2018-04-27 MED ORDER — ROCURONIUM BROMIDE 100 MG/10ML IV SOLN
INTRAVENOUS | Status: DC | PRN
  Administered 2018-04-27: 50 mg via INTRAVENOUS

## 2018-04-27 MED ORDER — CELECOXIB 200 MG PO CAPS
400.0000 mg | ORAL_CAPSULE | ORAL | Status: AC
  Administered 2018-04-27: 400 mg via ORAL

## 2018-04-27 MED ORDER — FAMOTIDINE 20 MG PO TABS: 20.0000 mg | ORAL_TABLET | Freq: Two times a day (BID) | ORAL | Status: DC | PRN

## 2018-04-27 MED ORDER — FENTANYL CITRATE (PF) 250 MCG/5ML IJ SOLN
INTRAMUSCULAR | Status: AC
  Filled 2018-04-27: qty 5

## 2018-04-27 MED ORDER — GABAPENTIN 300 MG PO CAPS
300.0000 mg | ORAL_CAPSULE | ORAL | Status: AC
  Administered 2018-04-27: 300 mg via ORAL

## 2018-04-27 MED ORDER — LACTATED RINGERS IV SOLN
INTRAVENOUS | Status: DC
  Administered 2018-04-27: 125 mL/h via INTRAVENOUS

## 2018-04-27 MED ORDER — SCOPOLAMINE 1 MG/3DAYS TD PT72
MEDICATED_PATCH | TRANSDERMAL | Status: AC
  Administered 2018-04-27: 1.5 mg via TRANSDERMAL
  Filled 2018-04-27: qty 1

## 2018-04-27 MED ORDER — DEXAMETHASONE SODIUM PHOSPHATE 10 MG/ML IJ SOLN
INTRAMUSCULAR | Status: DC | PRN
  Administered 2018-04-27: 4 mg via INTRAVENOUS

## 2018-04-27 MED ORDER — HYDROMORPHONE HCL 1 MG/ML IJ SOLN
INTRAMUSCULAR | Status: DC | PRN
  Administered 2018-04-27: 0.5 mg via INTRAVENOUS

## 2018-04-27 SURGICAL SUPPLY — 42 items
APL SKNCLS STERI-STRIP NONHPOA (GAUZE/BANDAGES/DRESSINGS) ×2
BENZOIN TINCTURE PRP APPL 2/3 (GAUZE/BANDAGES/DRESSINGS) ×2 IMPLANT
CANISTER SUCT 3000ML PPV (MISCELLANEOUS) ×4 IMPLANT
CLOSURE WOUND 1/2 X4 (GAUZE/BANDAGES/DRESSINGS) ×1
CONT PATH 16OZ SNAP LID 3702 (MISCELLANEOUS) ×4 IMPLANT
DRAPE CESAREAN BIRTH W POUCH (DRAPES) ×2 IMPLANT
DRSG OPSITE POSTOP 4X10 (GAUZE/BANDAGES/DRESSINGS) ×4 IMPLANT
DURAPREP 26ML APPLICATOR (WOUND CARE) ×4 IMPLANT
ELECT BLADE 6.5 EXT (BLADE) ×2 IMPLANT
GAUZE SPONGE 4X4 16PLY XRAY LF (GAUZE/BANDAGES/DRESSINGS) ×2 IMPLANT
GLOVE BIOGEL PI IND STRL 6.5 (GLOVE) ×2 IMPLANT
GLOVE BIOGEL PI IND STRL 7.0 (GLOVE) ×4 IMPLANT
GLOVE BIOGEL PI INDICATOR 6.5 (GLOVE) ×2
GLOVE BIOGEL PI INDICATOR 7.0 (GLOVE) ×4
GLOVE ECLIPSE 6.0 STRL STRAW (GLOVE) ×4 IMPLANT
GOWN STRL REUS W/TWL LRG LVL3 (GOWN DISPOSABLE) ×8 IMPLANT
HEMOSTAT ARISTA ABSORB 3G PWDR (MISCELLANEOUS) ×2 IMPLANT
NEEDLE HYPO 22GX1.5 SAFETY (NEEDLE) ×2 IMPLANT
PACK ABDOMINAL GYN (CUSTOM PROCEDURE TRAY) ×4 IMPLANT
PAD OB MATERNITY 4.3X12.25 (PERSONAL CARE ITEMS) ×4 IMPLANT
PROTECTOR NERVE ULNAR (MISCELLANEOUS) ×4 IMPLANT
SPONGE LAP 18X18 X RAY DECT (DISPOSABLE) ×8 IMPLANT
STRIP CLOSURE SKIN 1/2X4 (GAUZE/BANDAGES/DRESSINGS) ×1 IMPLANT
SUT MNCRL AB 3-0 PS2 27 (SUTURE) ×2 IMPLANT
SUT PDS AB 0 CTX 60 (SUTURE) ×2 IMPLANT
SUT PLAIN 2 0 (SUTURE)
SUT PLAIN 2 0 XLH (SUTURE) ×2 IMPLANT
SUT PLAIN ABS 2-0 54XMFL TIE (SUTURE) IMPLANT
SUT VIC AB 0 CT1 18XCR BRD8 (SUTURE) ×6 IMPLANT
SUT VIC AB 0 CT1 27 (SUTURE) ×12
SUT VIC AB 0 CT1 27XBRD ANBCTR (SUTURE) ×4 IMPLANT
SUT VIC AB 0 CT1 8-18 (SUTURE) ×12
SUT VIC AB 0 CTX 36 (SUTURE) ×8
SUT VIC AB 0 CTX36XBRD ANBCTRL (SUTURE) ×4 IMPLANT
SUT VIC AB 2-0 CT1 (SUTURE) ×10 IMPLANT
SUT VIC AB 2-0 SH 27 (SUTURE) ×4
SUT VIC AB 2-0 SH 27XBRD (SUTURE) IMPLANT
SUT VIC AB 4-0 KS 27 (SUTURE) ×4 IMPLANT
SUT VICRYL 0 TIES 12 18 (SUTURE) ×4 IMPLANT
SYR CONTROL 10ML LL (SYRINGE) ×2 IMPLANT
TOWEL OR 17X24 6PK STRL BLUE (TOWEL DISPOSABLE) ×8 IMPLANT
TRAY FOLEY W/BAG SLVR 14FR (SET/KITS/TRAYS/PACK) ×4 IMPLANT

## 2018-04-27 NOTE — Progress Notes (Signed)
Pt seen and examined.  Doing well.  Tolerating PO, no n/v/  BP 126/76 (BP Location: Left Arm)   Pulse (!) 58   Temp 98.2 F (36.8 C) (Oral)   Resp 18   Ht 5' (1.524 m)   Wt 70.3 kg (155 lb)   LMP 04/25/2018 (LMP Unknown)   SpO2 100%   BMI 30.27 kg/m   Lab Results  Component Value Date   WBC 13.4 (H) 04/27/2018   HGB 10.9 (L) 04/27/2018   HCT 31.9 (L) 04/27/2018   MCV 85.3 04/27/2018   PLT 192 04/27/2018   NAD Abd: soft/ app TTP / incision c/d/i Ext: +SCDs  A&P: OOB now D/c IVF Foley catheter out this evening.

## 2018-04-27 NOTE — Transfer of Care (Signed)
Immediate Anesthesia Transfer of Care Note  Patient: Linda Baldwin  Procedure(s) Performed: HYSTERECTOMY ABDOMINAL (N/A )  Patient Location: PACU  Anesthesia Type:General  Level of Consciousness: awake, alert  and oriented  Airway & Oxygen Therapy: Patient Spontanous Breathing and Patient connected to nasal cannula oxygen  Post-op Assessment: Report given to RN and Post -op Vital signs reviewed and stable  Post vital signs: Reviewed and stable  Last Vitals:  Vitals Value Taken Time  BP 125/73 04/27/2018 11:15 AM  Temp    Pulse 68 04/27/2018 11:18 AM  Resp 13 04/27/2018 11:18 AM  SpO2 99 % 04/27/2018 11:18 AM  Vitals shown include unvalidated device data.  Last Pain:  Vitals:   04/27/18 0732  TempSrc: Oral  PainSc: 0-No pain      Patients Stated Pain Goal: 3 (70/96/43 [id number])  Complications: No apparent anesthesia complications

## 2018-04-27 NOTE — Op Note (Signed)
Operative Note  PRE-OPERATIVE DIAGNOSIS: Uterine Fibroids  POST-OPERATIVE DIAGNOSIS:  Uterine Fibroids  PROCEDURE:  Procedure(s): HYSTERECTOMY ABDOMINAL (N/A) and Bilateral salpingectomy  SURGEON:  Surgeon(s) and Role:    Jerelyn Charles, MD - Primary    * Bobbye Charleston, MD - Assisting  ANESTHESIA:   general  EBL:  1000 mL   BLOOD ADMINISTERED:none  SPECIMEN:  uterus, cervix, bilateral tubes  DISPOSITION OF SPECIMEN:  PATHOLOGY   FINDINGS:  Enlarged uterus, 22 week size.  Normal appearing ovaries bilaterally.  Tubes consistent with prior tubal ligation.  Following procedure, total specimen weight 1863 gm  PROCEDURE DETAILS  The patient was taken to the operating room and placed in the supine position.  General endotracheal anesthesia was induced without difficulty.  The vagina and abdomen were prepped and draped in the usual sterile manner.  A foley catheter was placed in her bladder.  The uterus extended approximately 2 cm superior to the umbilicus, but was freely mobile, so the decision was made to proceed with a low transverse incision.  A Pfannenstiel incision was made and carried down through the subcutaneous tissue to the fascia.  The fascia was incised in the midline and the fascial incision was extended laterally with Mayo scissors. The superior aspect of the fascial incision was grasped with two Kocher clamps, tented up and the rectus muscles dissected off sharply. The rectus was then dissected off with blunt dissection and Mayo scissors inferiorly. The rectus muscles were separated in the midline. The abdominal peritoneum was identified and entered sharply with Metzenbaum scissors.  The incision was extended superiorly and inferiorly.  The uterus was delivered through the incision.  The Balfour retractor was placed and bowel packed away superiorly with wet laps.  The findings are noted above.  The round ligament on the patient's left was clamped, cut, and ligated  with 0 Vicryl suture.  The mesosalpinx of the left fallopian tube was clamped and suture ligated.  The left ovary and right ovary were both adherent to the uterus.  Taking multiple sequential bites, the ovarian ligament was then clamped, cut, and suture ligated x2 with 0 Vicryl.  It was carefully freed from the side of the uterus.  The broad ligament was then dissected free.  A similar procedure was performed on the patient's right.  The bladder was dissected off of the cervix.  The uterine arteries bilaterally were clamped, cut, and ligated with 0 Vicryl suture.   In an effort to get better exposure, the uterus was transected just inferior to the internal cervical os and the bulk of the uterine body removed.  The cervix was then more easily identified.  The cardinal ligaments were serially clamped, cut, and ligated with 0 Vicryl suture.  The vagina was crossclamped at the level of the external os and the remaining cervix removed.  The angles were closed with 0 Vicryl suture in a Heaney fashion, then the vaginal cuff closed with 0 Vicryl suture in a running locked fashion.  The vaginal cuff was hemostatis.  There was a small area of bleeding adjacent to the left IP.  The ureter was well below the level of bleeding. The small bleeder was grasped with a right angle and secured with 3-0 vicryl figure of eight.  In a similar manner, a small bleeder on the right ovarian pedicle was controlled.  Given that the uterus was enlarged and irregular with a generous blood supply, there was a greater than expected amount of bleeding, mostly from back-bleeding. However,  the patient remained hemodynamically stable throughout the procedure. The pelvis was copiously irrigated and felt to be hemostatic.   The ureters were again identified bilaterally and normal peristalsis seen.  Arista was placed in the pelvis along the cuff and bilateral pelvis side walls. Hemostasis was then again checked.  Following this, all laps and retractors  were removed.  The laparotomy count was confirmed correct.    The peritoneum was closed with 2-0 vicryl in a running fashion.  The fascia and rectus muscles were inspected and were hemostatic. The fascia was closed with 0 Vicryl in a running fashion. The subcuticular layer was irrigated and all bleeders cauterized.  The subcutaneous tissue was closed with interrupted 2-0 plain gut.  The skin was closed with 3-0 monocryl in a subcuticular fashion. The incision was dressed with benzoine, steri strips and honeycomb dressing. All sponge lap and needle counts were correct x3.   Patient tolerated the procedure well and was transferred to the PACU in stable condition following the procedure.

## 2018-04-27 NOTE — Anesthesia Procedure Notes (Signed)
Procedure Name: Intubation Date/Time: 04/27/2018 8:26 AM Performed by: Bufford Spikes, CRNA Pre-anesthesia Checklist: Patient identified, Emergency Drugs available, Suction available and Patient being monitored Patient Re-evaluated:Patient Re-evaluated prior to induction Oxygen Delivery Method: Circle system utilized Preoxygenation: Pre-oxygenation with 100% oxygen Induction Type: IV induction Ventilation: Mask ventilation without difficulty Laryngoscope Size: Miller and 2 Grade View: Grade I Tube type: Oral Tube size: 7.0 mm Number of attempts: 1 Airway Equipment and Method: Stylet and Oral airway Placement Confirmation: ETT inserted through vocal cords under direct vision,  positive ETCO2 and breath sounds checked- equal and bilateral Secured at: 21 cm Tube secured with: Tape Dental Injury: Teeth and Oropharynx as per pre-operative assessment

## 2018-04-27 NOTE — Anesthesia Postprocedure Evaluation (Signed)
Anesthesia Post Note  Patient: Linda Baldwin  Procedure(s) Performed: HYSTERECTOMY ABDOMINAL (N/A ) BILATERAL SALPINGECTOMY     Patient location during evaluation: PACU Anesthesia Type: General Level of consciousness: awake and alert Pain management: pain level controlled Vital Signs Assessment: post-procedure vital signs reviewed and stable Respiratory status: spontaneous breathing, nonlabored ventilation, respiratory function stable and patient connected to nasal cannula oxygen Cardiovascular status: blood pressure returned to baseline and stable Postop Assessment: no apparent nausea or vomiting Anesthetic complications: no    Last Vitals:  Vitals:   04/27/18 1331 04/27/18 1435  BP: 128/76 126/76  Pulse: 64 (!) 58  Resp: 16 18  Temp: 37.1 C 36.8 C  SpO2: 100% 100%    Last Pain:  Vitals:   04/27/18 1435  TempSrc: Oral  PainSc:    Pain Goal: Patients Stated Pain Goal: 3 (04/27/18 1425)               Tiajuana Amass

## 2018-04-27 NOTE — Brief Op Note (Signed)
04/27/2018  11:05 AM  PATIENT:  Linda Baldwin  47 y.o. female  PRE-OPERATIVE DIAGNOSIS:  FIBROIDS  POST-OPERATIVE DIAGNOSIS:  FIBROIDS  PROCEDURE:  Procedure(s): HYSTERECTOMY ABDOMINAL (N/A)  SURGEON:  Surgeon(s) and Role:    Jerelyn Charles, MD - Primary    * Bobbye Charleston, MD - Assisting  ANESTHESIA:   general  EBL:  1000 mL   BLOOD ADMINISTERED:none  DRAINS: none   LOCAL MEDICATIONS USED:  NONE  SPECIMEN:  Source of Specimen:  uterus, cervix, bilateral tubes  DISPOSITION OF SPECIMEN:  PATHOLOGY  COUNTS:  YES  DICTATION: .Note written in EPIC  PLAN OF CARE: Admit to inpatient   PATIENT DISPOSITION:  PACU - hemodynamically stable.   Delay start of Pharmacological VTE agent (>24hrs) due to surgical blood loss or risk of bleeding: not applicable

## 2018-04-27 NOTE — Anesthesia Preprocedure Evaluation (Signed)
Anesthesia Evaluation  Patient identified by MRN, date of birth, ID band Patient awake    Reviewed: Allergy & Precautions, NPO status , Patient's Chart, lab work & pertinent test results  Airway Mallampati: II  TM Distance: >3 FB Neck ROM: Full    Dental  (+) Dental Advisory Given   Pulmonary neg pulmonary ROS,    breath sounds clear to auscultation       Cardiovascular negative cardio ROS   Rhythm:Regular Rate:Normal     Neuro/Psych negative neurological ROS     GI/Hepatic negative GI ROS, Neg liver ROS,   Endo/Other  negative endocrine ROS  Renal/GU negative Renal ROS     Musculoskeletal   Abdominal   Peds  Hematology negative hematology ROS (+)   Anesthesia Other Findings   Reproductive/Obstetrics                             Lab Results  Component Value Date   WBC 7.8 04/20/2018   HGB 13.2 04/20/2018   HCT 39.0 04/20/2018   MCV 85.0 04/20/2018   PLT 202 04/20/2018    Anesthesia Physical Anesthesia Plan  ASA: I  Anesthesia Plan: General   Post-op Pain Management:    Induction: Intravenous  PONV Risk Score and Plan: 4 or greater and Scopolamine patch - Pre-op, Midazolam, Dexamethasone, Ondansetron and Treatment may vary due to age or medical condition  Airway Management Planned: Oral ETT  Additional Equipment:   Intra-op Plan:   Post-operative Plan: Extubation in OR  Informed Consent: I have reviewed the patients History and Physical, chart, labs and discussed the procedure including the risks, benefits and alternatives for the proposed anesthesia with the patient or authorized representative who has indicated his/her understanding and acceptance.   Dental advisory given  Plan Discussed with: CRNA  Anesthesia Plan Comments:         Anesthesia Quick Evaluation

## 2018-04-27 NOTE — H&P (Signed)
47 y.o.  [id number] presents for abdominal hysterectomy for fibroid uterus and history of menorrhagia to anemia.   She has a long history of heavy menses, but was not problmatic until this past April.  She was seen at the [facility] ED and found to have a hemoglobin of 5.  She received 4U PRBCs.  She was seen by GI and had normal upper endoscopy, but physical exam revealed an enlarged uterus.    She had a CT abdomen / pelvis which showed a markedly enlarged fibroid uterus is identified, but no pelvic lymphadenopathy or other intra-abdominal findings.  She had [place] that demonstrated a 17.3 x 11.6 x 13.3 cm fibroid uterus. Multiple intramural masses comparable with fibroids as seen on CT including a 4 x 4 x 3 cm fibroid in the anterior fundus and 2 fibroids posteriorly in the mid-uterine segment measuring 4.3 x 4.5 x 4.3 cm on the left and 6.3 x 6.8 x 4.5 cm on the right. 4 cm benign appearing cyst on left ovary.  She was counseled on medical and surgical options for management of heavy menstrual bleeding and desires definitive surgical management.   She has been on iron replacement since her hospitalization in April and her most recent hemoglobin is 13.2.  Past Medical History:  Diagnosis Date  . Anemia   . Heart murmur    Dx in early teens, never has caused any problems  . History of blood transfusion 01/2018   at [facility]  . SVD (spontaneous vaginal delivery)    x 2  . Termination of pregnancy (fetus)    at a clinic    Past Surgical History:  Procedure Laterality Date  . ESOPHAGOGASTRODUODENOSCOPY N/A 01/17/2018   Procedure: ESOPHAGOGASTRODUODENOSCOPY (EGD);  Surgeon: Laurence Spates, MD;  Location: [facility];  Service: Endoscopy;  Laterality: N/A;  . TUBAL LIGATION    . WISDOM TOOTH EXTRACTION      OB History  No data available    Social History   Socioeconomic History  . Marital status: Single    Spouse name: Not on file  . Number of children: Not on file  . Years of education: Not on file  .  Highest education level: Not on file  Tobacco Use  . Smoking status: Never Smoker  . Smokeless tobacco: Never Used  Substance and Sexual Activity  . Alcohol use: No  . Drug use: Yes    Types: Marijuana    Comment: Last Use 04/09/18  . Sexual activity: Yes    Birth control/protection: None   Patient has no known allergies.    Vitals:   04/27/18 0732  BP: 113/80  Pulse: 75  Resp: 18  Temp: 98.1 F (36.7 C)  SpO2: 99%     General:  NAD Abdomen:  Soft, enlarged uterus to umbilicus Ex:  no edema  UPT negative in holding  A/P   47 y.o. presents for total abdominal hysterectomy and bilateral salpingectomy. Endometrial biopsy was not able to be obtained prior to surgery due to markedly displaced cervix behind pubic bone.  Given long history of heavy menses and known fibroids, risk of occult malignancy is low and patient declined referral to gyn oncology and understands that if cancer is identified on final pathology she may require a second surgery.   Discussed in detail risk, benefits, alternatives to procedures.  We discussed risks to include infection, bleeding, damage to surrounding structures (including but not limited to bowel, bladder, ureters, nerves, vessels, ovaries), cuff dehiscence or fistula, vte /  pe, need for blood transfusion, complications of anesthesia, need for additional procedures. She wishes to proceed.  All questions answered.  Consent signed.  UPT negative in holding.   Ancef 2gm preoperatively [place]

## 2018-04-28 ENCOUNTER — Encounter (HOSPITAL_COMMUNITY): Payer: Self-pay | Admitting: Obstetrics

## 2018-04-28 NOTE — Progress Notes (Signed)
Patient is doing well.  She is tolerating PO, ambulating, voiding.  Pain is controlled.  VB is scant. Has not passed flatus.  Vitals:   04/27/18 1814 04/27/18 1958 04/27/18 2323 04/28/18 0434  BP: 127/82 117/84 106/83 (!) 98/57  Pulse: 68 78 77 62  Resp: 18 18 20 16   Temp: 98.5 F (36.9 C) 97.9 F (36.6 C) 98.1 F (36.7 C) 98.1 F (36.7 C)  TempSrc: Oral  Oral   SpO2: 100% 98% 100% 100%  Weight:      Height:        NAD Lungs:   clear to auscultation Heart:   RRR Abdomen:  +BS, quiet. soft, appropriate tenderness, non-distended.  incision intact and without erythema or drainage.  ext:    Symmetric, no edema bilaterally  Lab Results  Component Value Date   WBC 13.4 (H) 04/27/2018   HGB 10.9 (L) 04/27/2018   HCT 31.9 (L) 04/27/2018   MCV 85.3 04/27/2018   PLT 192 04/27/2018     A/P    47 y.o. POD 1 s/p TAH/BS for uterine fibroids Doing well post op.  Cont ambulation and await return of bowel function.  Possible discharge home this afternoon

## 2018-04-29 MED ORDER — IBUPROFEN 600 MG PO TABS: 600.0000 mg | ORAL_TABLET | Freq: Four times a day (QID) | ORAL | 0 refills | Status: AC

## 2018-04-29 MED ORDER — FERROUS SULFATE 325 (65 FE) MG PO TBEC: 325.0000 mg | DELAYED_RELEASE_TABLET | Freq: Every day | ORAL | 1 refills | Status: AC

## 2018-04-29 NOTE — Progress Notes (Signed)
Patient is doing well.  She is tolerating PO, ambulating, voiding.  Pain is controlled. No VB.  + flatus  Vitals:   04/28/18 1530 04/28/18 2014 04/29/18 0042 04/29/18 0521  BP: 108/69 118/67 103/65 (!) 99/55  Pulse: 72 65 73 60  Resp: 17 18 18 18   Temp: 97.7 F (36.5 C) 98.6 F (37 C) 98.4 F (36.9 C) 98.4 F (36.9 C)  TempSrc: Oral Oral Oral Oral  SpO2: 100% 99% 100% 99%  Weight:      Height:        NAD Lungs:   clear to auscultation Heart:   RRR Abdomen:  +BS. soft, non-tender, non-distended.  incision intact and without erythema or drainage.  ext:    Symmetric, no edema bilaterally  Lab Results  Component Value Date   WBC 13.4 (H) 04/27/2018   HGB 10.9 (L) 04/27/2018   HCT 31.9 (L) 04/27/2018   MCV 85.3 04/27/2018   PLT 192 04/27/2018     A/P    47 y.o. POD 2s/p TAH/BS for uterine fibroids Doing well post op.  Meeting all goals.  D/c to home today

## 2018-04-29 NOTE — Discharge Instructions (Signed)
You may wash incision with soap and water.    Do not soak the incision for 2 weeks (no tub baths or swimming).    Keep incision dry.  If you note drainage, increased pain, or increased redness of the incision, then please notify your physician.  Pelvic rest x 6 weeks (no intercourse or tampons)   No lifting over 10 lbs for 6 weeks.   You have a mild anemia due to blood loss from surgery.  Please continue to take one iron tablet daily.  You may experience constipation from the iron, so add a stool softener as needed.

## 2018-04-29 NOTE — Discharge Summary (Signed)
Physician Discharge Summary  Patient ID: Linda Baldwin MRN: [id number] DOB/AGE: 02/27/1971 47 y.o.  Admit date: 04/27/2018 Discharge date: 04/29/2018  Admission Diagnoses:  Discharge Diagnoses:  Active Problems:   Leiomyoma of uterus   Discharged Condition: good  Hospital Course: Patient was admitted to the OR where she underwent a TAH/BS for large fibroid uterus.  She had an EBL of 1000 mL.  She was transferred to the floor where she had an uneventful post operative course.  Her hemoglobin on the evening of POD#0 was 10.9.  On POD#2, she was ambulating without difficulty, voiding, tolerating a normal diet without nausea or vomiting and her pain was controlled on motrin alone.  She was afebrile with stable vital signs.   Consults: None   Discharge Exam: Blood pressure (!) 99/55, pulse 60, temperature 98.4 F (36.9 C), temperature source Oral, resp. rate 18, height 5' (1.524 m), weight 70.3 kg (155 lb), last menstrual period 04/25/2018, SpO2 99 %. General appearance: alert, cooperative and appears stated age Incision/Wound: c/d/i  Disposition: Discharge disposition: 01-Home or Self Care       Discharge Instructions    Call MD for:  difficulty breathing, headache or visual disturbances   Complete by:  As directed    Call MD for:  extreme fatigue   Complete by:  As directed    Call MD for:  hives   Complete by:  As directed    Call MD for:  persistant dizziness or light-headedness   Complete by:  As directed    Call MD for:  persistant nausea and vomiting   Complete by:  As directed    Call MD for:  redness, tenderness, or signs of infection (pain, swelling, redness, odor or green/yellow discharge around incision site)   Complete by:  As directed    Call MD for:  severe uncontrolled pain   Complete by:  As directed    Call MD for:  temperature >100.4   Complete by:  As directed    Driving restriction   Complete by:  As directed    Avoid driving for at least 2 weeks.    Lifting restrictions   Complete by:  As directed    Weight restriction of 10 lbs.   No dressing needed   Complete by:  As directed    Remove dressing in 24 hours   Complete by:  As directed    Sexual acrtivity   Complete by:  As directed    Pelvic rest x 6 weeks (no sex or tampons)     Allergies as of 04/29/2018   No Known Allergies     Medication List    STOP taking these medications   metroNIDAZOLE 500 MG tablet Commonly known as:  FLAGYL   norethindrone 5 MG tablet Commonly known as:  AYGESTIN     TAKE these medications   docusate sodium 100 MG capsule Commonly known as:  COLACE Take 1 capsule (100 mg total) by mouth 2 (two) times daily. What changed:  when to take this   famotidine 20 MG tablet Commonly known as:  PEPCID Take 1 tablet (20 mg total) by mouth 2 (two) times daily. What changed:    when to take this  reasons to take this   ferrous sulfate 325 (65 FE) MG EC tablet Take 1 tablet (325 mg total) by mouth daily with breakfast. What changed:    how much to take  how to take this  when to take this  additional  instructions   ibuprofen 600 MG tablet Commonly known as:  ADVIL,MOTRIN Take 1 tablet (600 mg total) by mouth every 6 (six) hours.      Follow-up Information    Jerelyn Charles, MD. Schedule an appointment as soon as possible for a visit in 2 week(s).   Specialty:  Obstetrics Why:  post op visit in 10-14 days Contact information: [address] [place] [address] [phone]           Signed: [place] 04/29/2018, 7:43 AM

## 2018-04-29 NOTE — Progress Notes (Signed)
Discharge instructions given to and discussed with patient.  Reviewed postoperative care, medication changes, and follow up appointments. Patient verbalized understanding. Paperwork signed at this time.

## 2018-04-30 LAB — TYPE AND SCREEN
ABO/RH(D): O POS
Antibody Screen: POSITIVE
DONOR AG TYPE: NEGATIVE
DONOR AG TYPE: NEGATIVE
UNIT DIVISION: 0
UNIT DIVISION: 0

## 2018-04-30 LAB — BPAM RBC
BLOOD PRODUCT EXPIRATION DATE: 201907262359
Blood Product Expiration Date: 201907252359
ISSUE DATE / TIME: 201906251406
Unit Type and Rh: 5100
Unit Type and Rh: 9500

## 2018-07-05 IMAGING — US US PELVIS COMPLETE TRANSABD/TRANSVAG
1 series · 13 of 25 positions shown · non-contrast
Comparison: CT abdomen and pelvis 01/16/2018

CLINICAL DATA: Vaginal bleeding.



[Series 1: us pelvis complete transabd/transvag · 0.27mm/px · 13 of 98 slices shown]
[im 1/98]
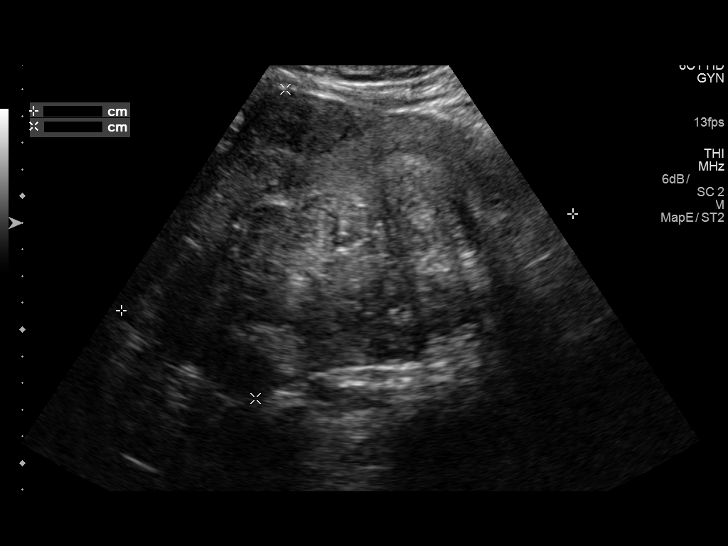
[im 9/98]
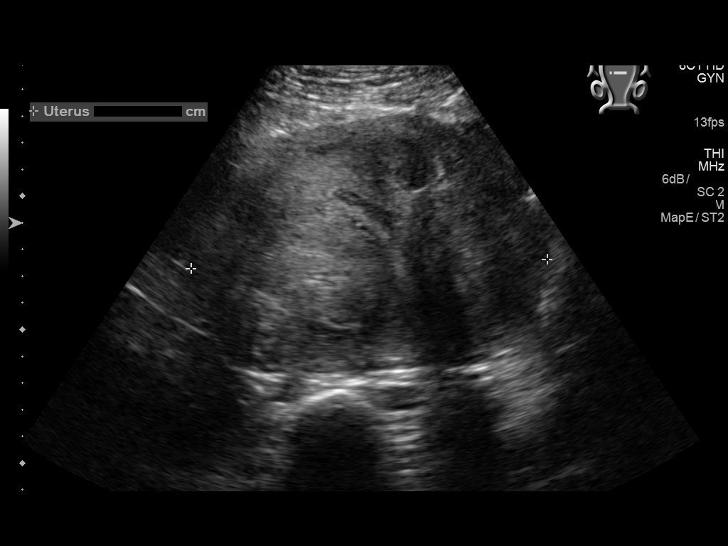
[im 17/98]
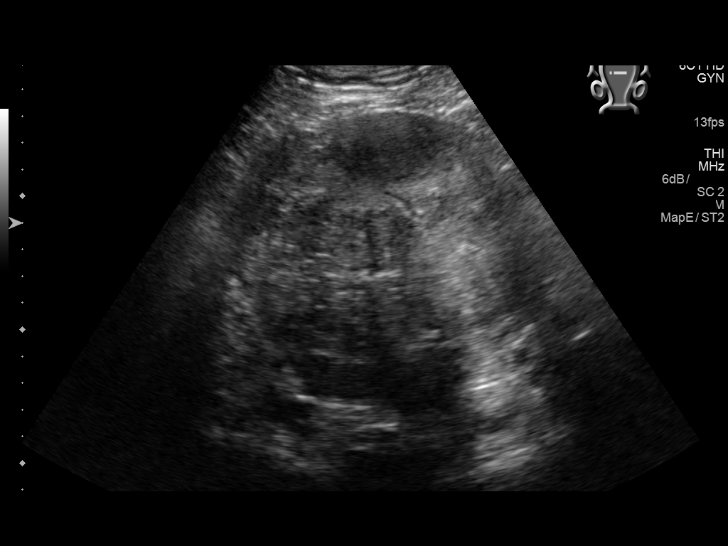
[im 25/98]
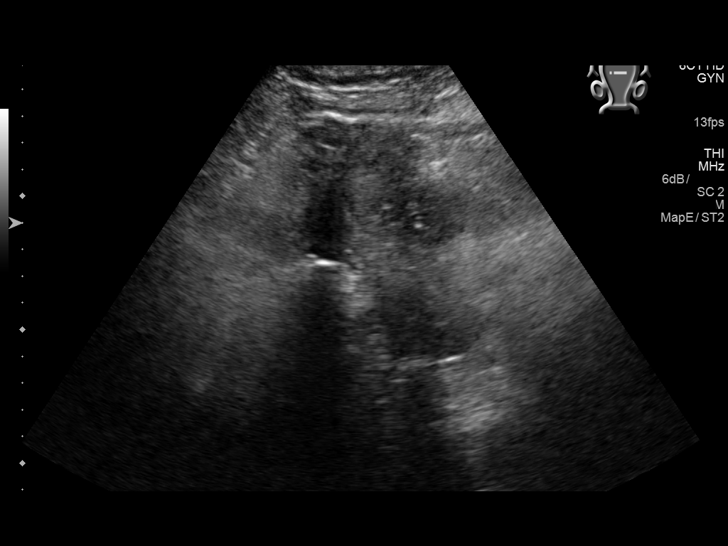
[im 33/98]
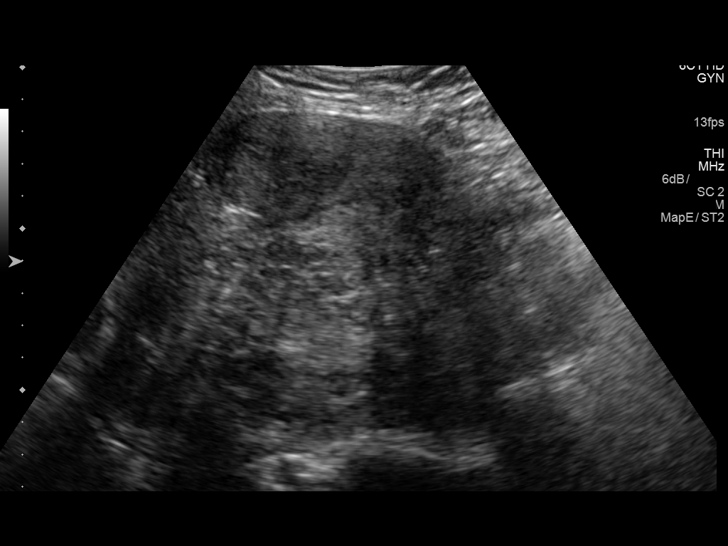
[im 41/98]
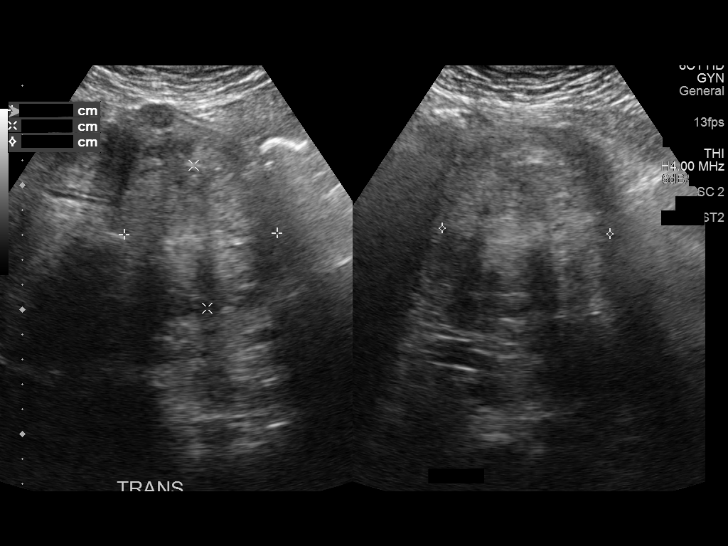
[im 49/98]
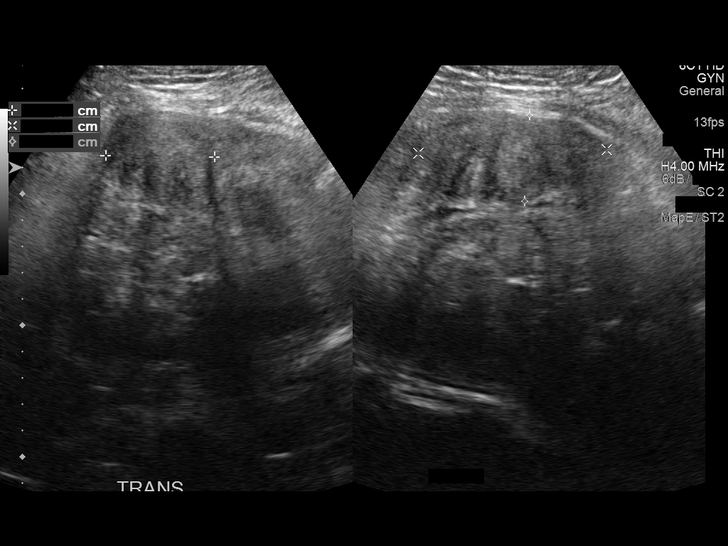
[im 57/98]
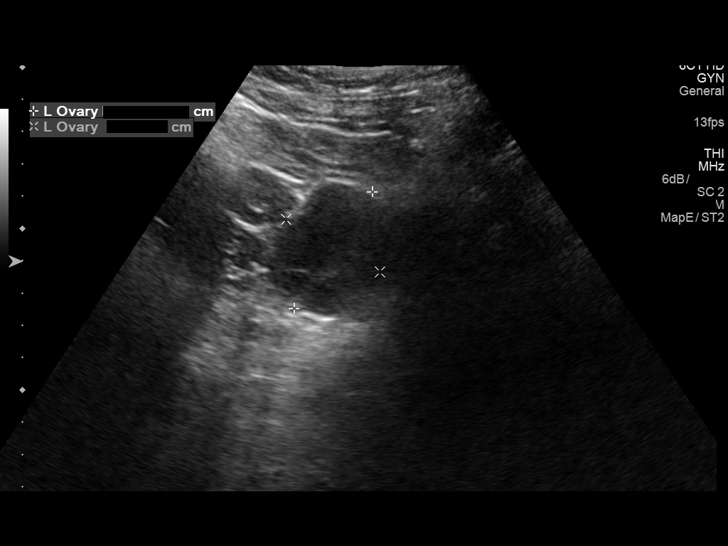
[im 65/98]
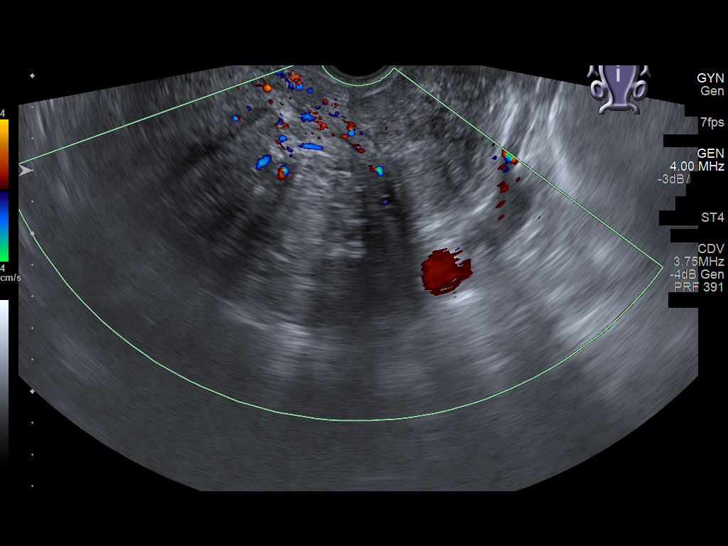
[im 73/98]
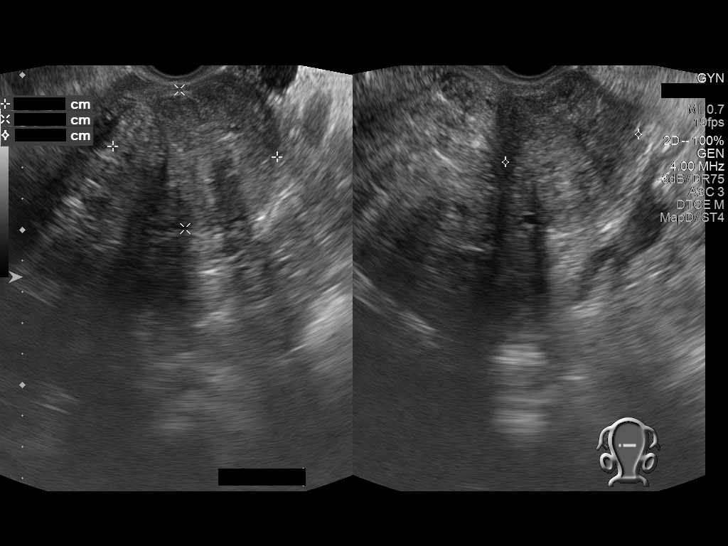
[im 81/98]
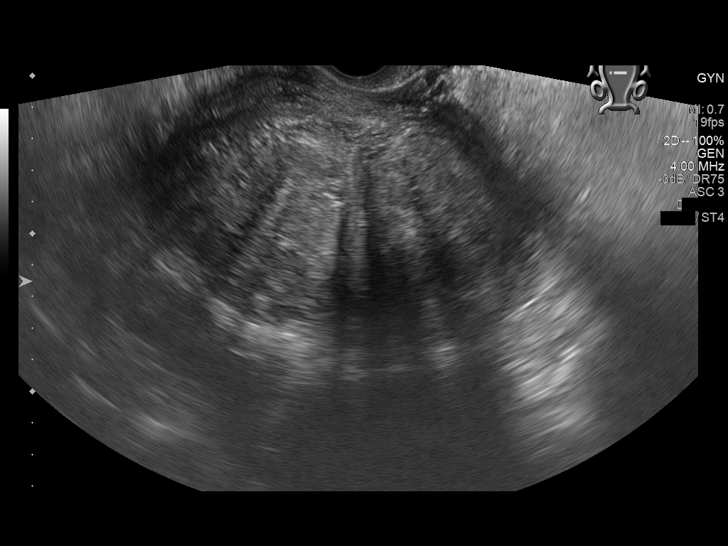
[im 89/98]
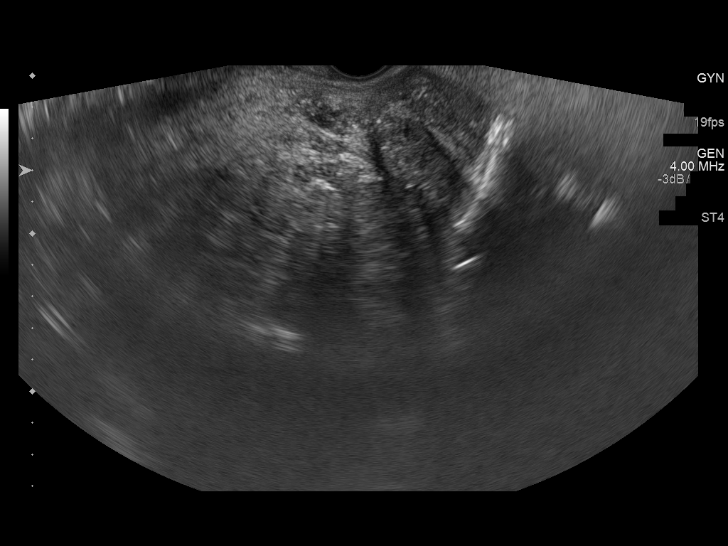
[im 98/98]
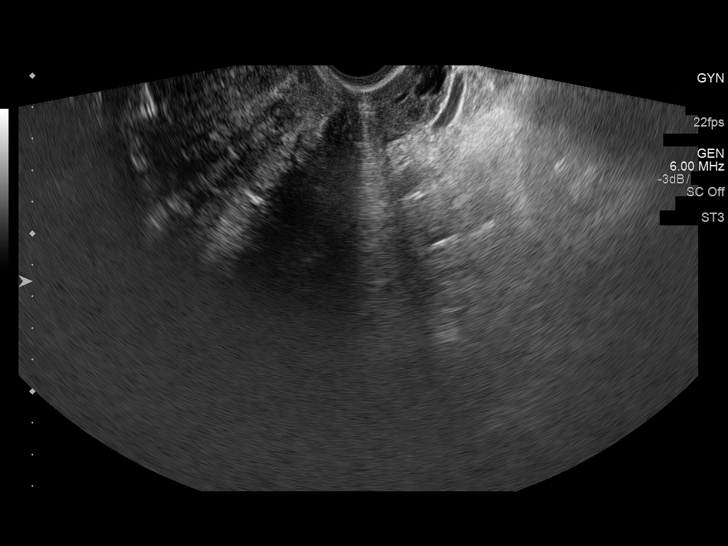

[13 of 25 positions shown; findings below may reference images not displayed]

FINDINGS: Uterus

Measurements: 17.3 x 11.6 x 13.3 cm. Multiple intramural masses
compatible with fibroids as seen on CT including a 4 x 4 x 3 cm
fibroid in the anterior fundus and 2 fibroids posteriorly in the mid
uterine segment measuring 4.3 x 4.5 x 4.3 cm on the left and 6.3 x
6.8 x 4.5 cm on the right.

Endometrium

Thickness: 14 mm.  Endometrium distorted by the fibroids.

Right ovary

Measurements: 4.2 x 2.2 x 1.7 cm. Normal appearance/no adnexal mass.

Left ovary

Measurements: 4.4 x 3.1 x 4.1 cm. 4.1 x 2.0 x 1.6 cm benign
appearing cyst.

Other findings

No abnormal free fluid.
IMPRESSION: 1. Multiple uterine fibroids.
2. Benign appearing left ovarian cyst.  Unremarkable right ovary.

## 2019-08-14 IMAGING — CT CT ABD-PELV W/ CM
2 of 5 series · 16 of 46 positions shown, 18 images · IV contrast (iopamidol)
Comparison: None

CLINICAL DATA: Abdominal pain.  Anemia.

EXAM:
CT ABDOMEN AND PELVIS WITH CONTRAST
TECHNIQUE: Multidetector CT imaging of the abdomen and pelvis was performed
using the standard protocol following bolus administration of
intravenous contrast.
CONTRAST:  100mL UKB3Y1-ORR IOPAMIDOL (UKB3Y1-ORR) INJECTION 61%

[Series 2: axial st · axial · 0.77mm/px · z∈[-422,+3]mm · 13 of 95 slices shown, 15 images]
[im 5/95  soft-tissue]
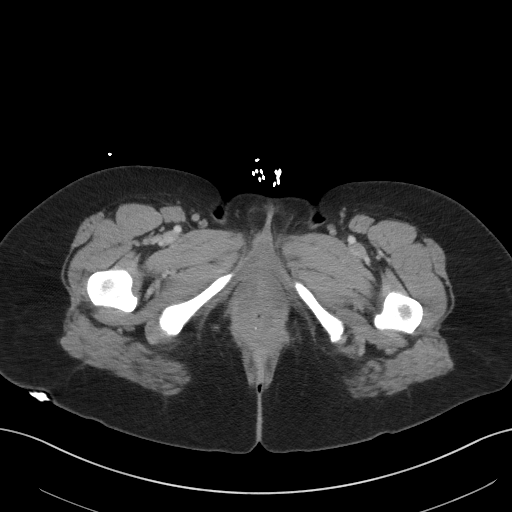
[im 5/95  bone]
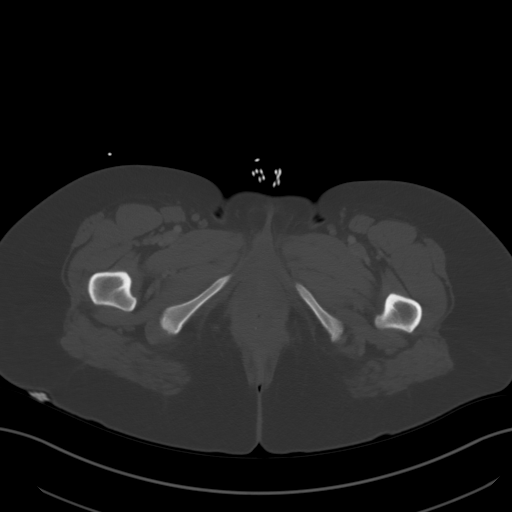
[im 15/95  soft-tissue]
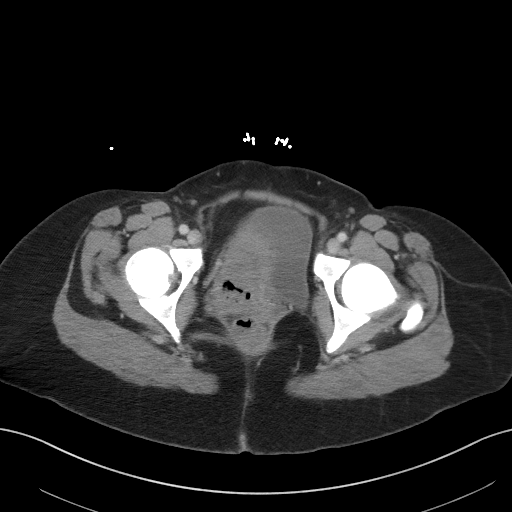
[im 19/95  soft-tissue]
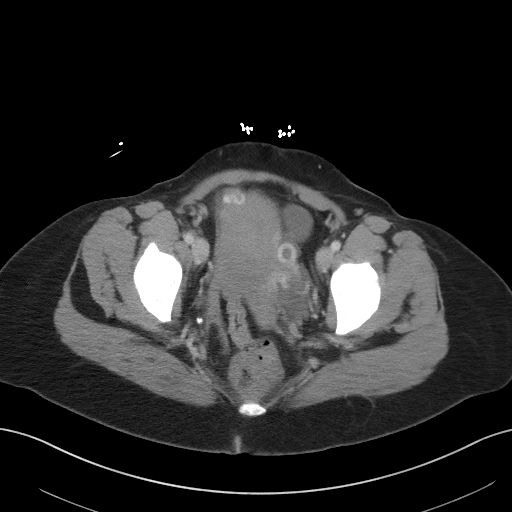
[im 29/95  soft-tissue]
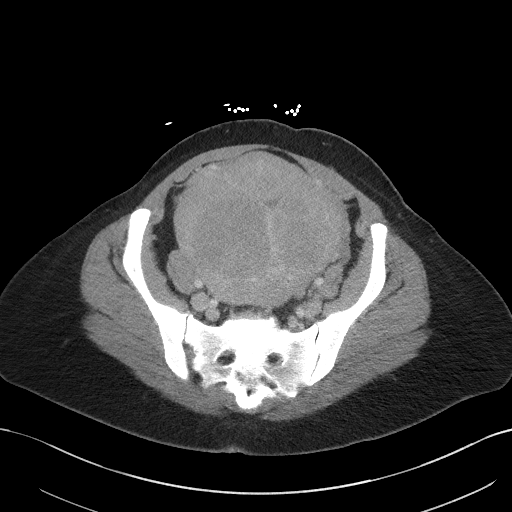
[im 33/95  soft-tissue]
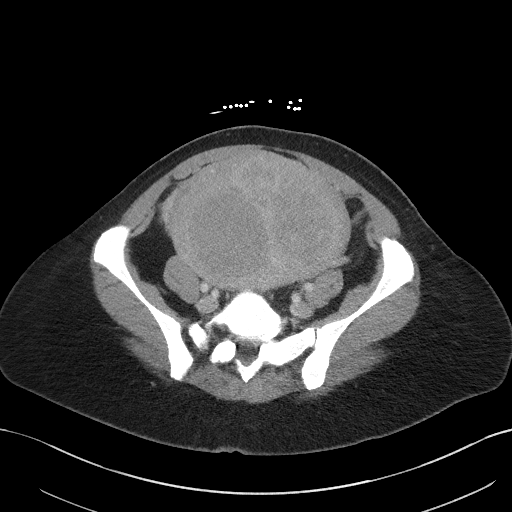
[im 43/95  soft-tissue]
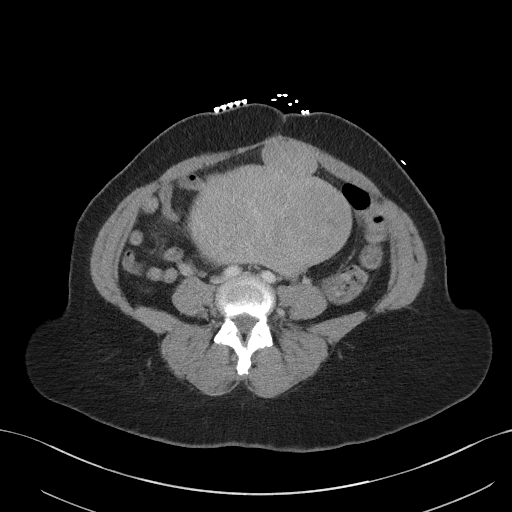
[im 48/95  soft-tissue]
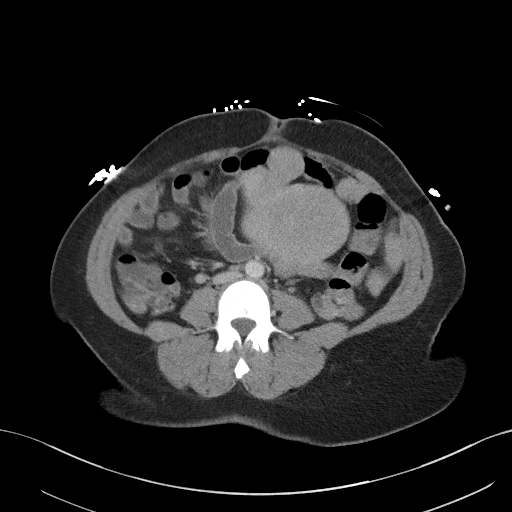
[im 52/95  soft-tissue]
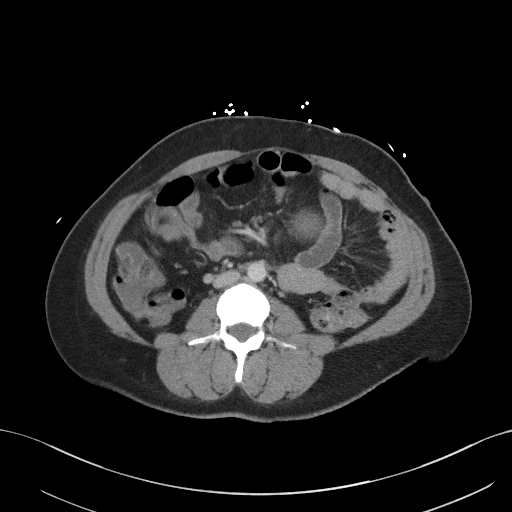
[im 62/95  soft-tissue]
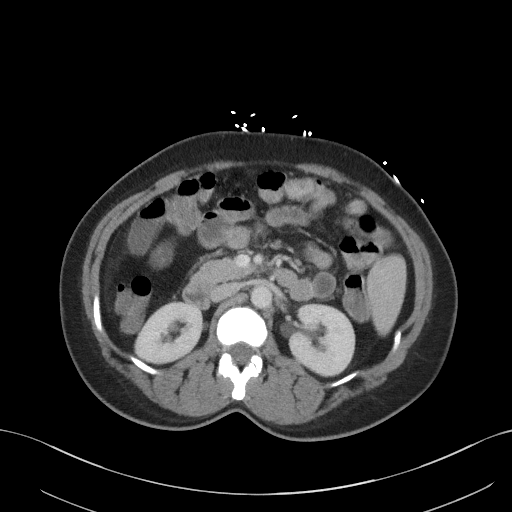
[im 62/95  bone]
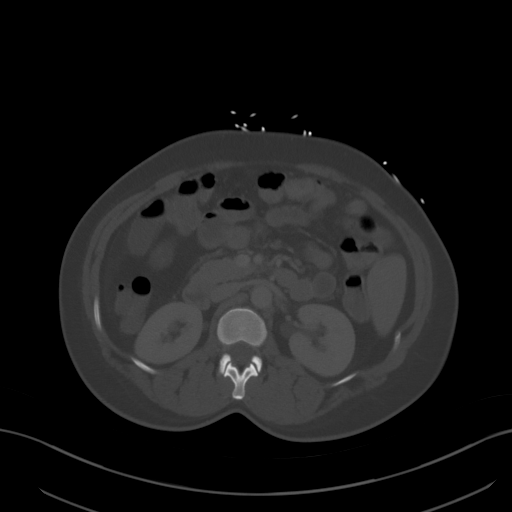
[im 66/95  soft-tissue]
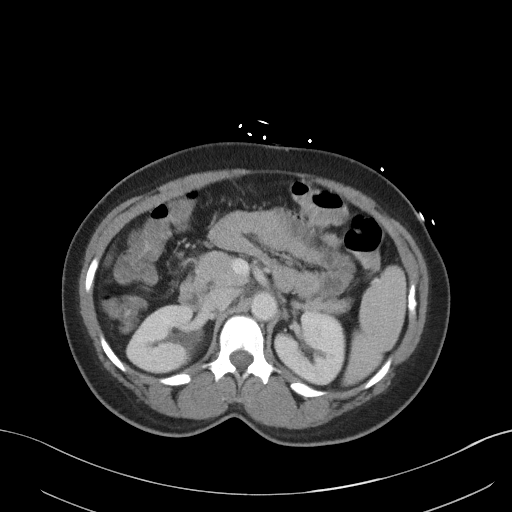
[im 76/95  soft-tissue]
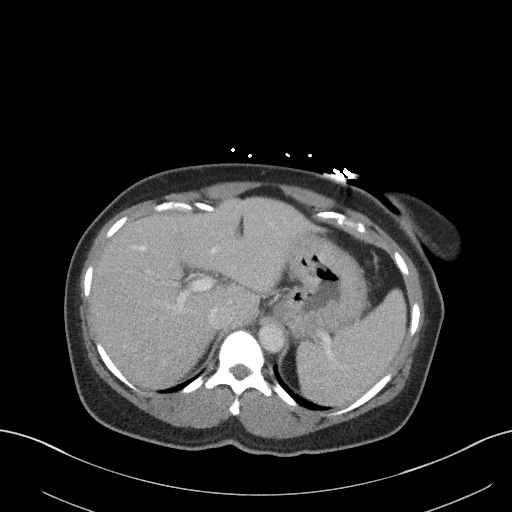
[im 80/95  soft-tissue]
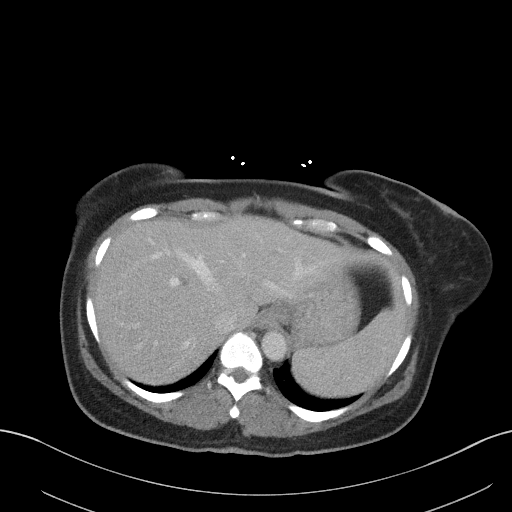
[im 90/95  soft-tissue]
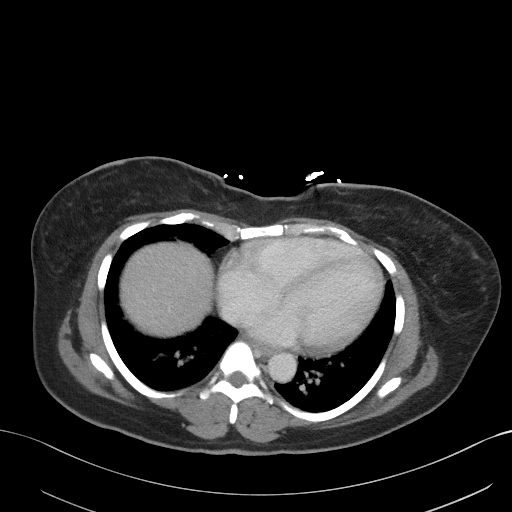

[Series 5: coronal st · coronal · 0.83mm/px · 3 of 88 slices shown]
[im 30/88  soft-tissue]
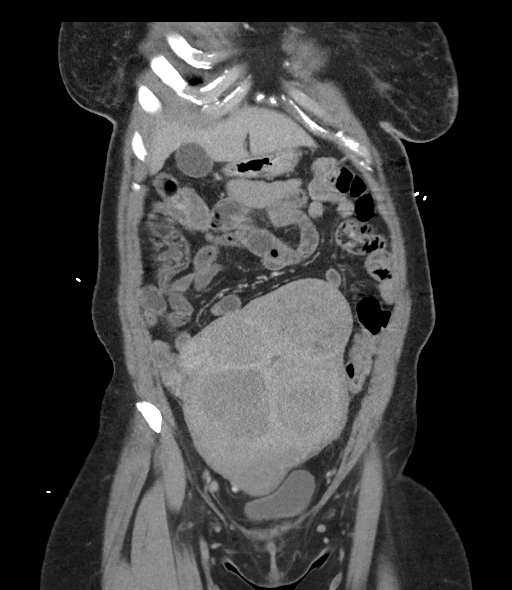
[im 39/88  soft-tissue]
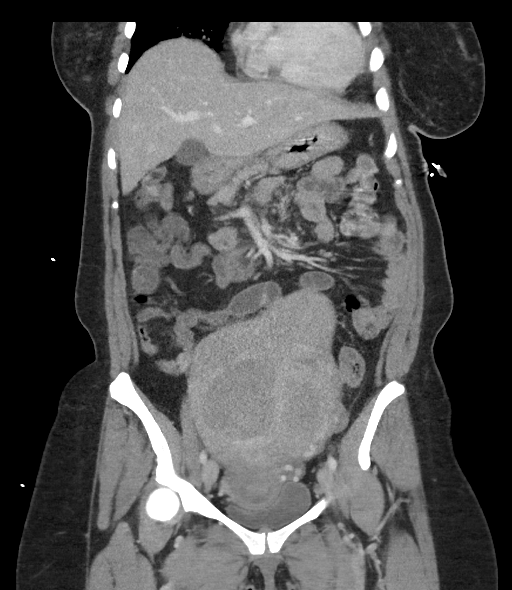
[im 49/88  soft-tissue]
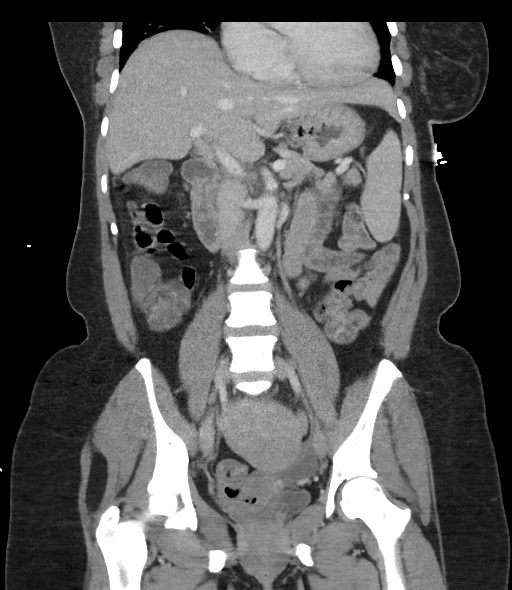

[16 of 46 positions shown; findings below may reference images not displayed]

FINDINGS: Lower chest: No acute abnormality.

Hepatobiliary: No focal liver abnormality is seen. No gallstones,
gallbladder wall thickening, or biliary dilatation.

Pancreas: Unremarkable. No pancreatic ductal dilatation or
surrounding inflammatory changes.

Spleen: Normal in size without focal abnormality.

Adrenals/Urinary Tract: The adrenal glands appear normal.
Unremarkable appearance of the kidneys. No mass or hydronephrosis.
The urinary bladder appears normal.

Stomach/Bowel: The stomach is normal. The small bowel loops have a
normal course and caliber. No evidence for obstruction. The appendix
is visualized and appears normal. No pathologic dilatation of the
colon.

Vascular/Lymphatic: Normal appearance of the abdominal aorta. No
enlarged retroperitoneal or mesenteric adenopathy. No enlarged
pelvic or inguinal lymph nodes.

Reproductive: Markedly enlarged fibroid uterus is identified. The
uterus measures 10.8 x 14.1 x 18.9 cm (volume = 6963 cm^3). Cyst
in the left ovary measures 3.7 cm.

Other: No free fluid or fluid collections.

Musculoskeletal: Degenerative disc disease noted within the lower
lumbar spine.
IMPRESSION: 1. No acute findings within the abdomen or pelvis.
2. Markedly enlarged fibroid uterus with a volume of approximately
2788 cc.
3. Left ovary cyst.

## 2021-04-20 ENCOUNTER — Emergency Department (HOSPITAL_BASED_OUTPATIENT_CLINIC_OR_DEPARTMENT_OTHER)
Admission: EM | Admit: 2021-04-20 | Discharge: 2021-04-20 | Disposition: A | Discharge status: Home / Self Care | Payer: 59 | Attending: Emergency Medicine | Admitting: Emergency Medicine

## 2021-04-20 ENCOUNTER — Other Ambulatory Visit: Payer: Self-pay

## 2021-04-20 ENCOUNTER — Encounter (HOSPITAL_BASED_OUTPATIENT_CLINIC_OR_DEPARTMENT_OTHER): Payer: Self-pay | Admitting: Emergency Medicine

## 2021-04-20 DIAGNOSIS — U071 COVID-19: Secondary | ICD-10-CM | POA: Diagnosis not present

## 2021-04-20 DIAGNOSIS — K644 Residual hemorrhoidal skin tags: Secondary | ICD-10-CM | POA: Insufficient documentation

## 2021-04-20 DIAGNOSIS — K625 Hemorrhage of anus and rectum: Secondary | ICD-10-CM | POA: Diagnosis present

## 2021-04-20 LAB — BASIC METABOLIC PANEL
Anion gap: 11 (ref 5–15)
BUN: 12 mg/dL (ref 6–20)
CO2: 24 mmol/L (ref 22–32)
Calcium: 9 mg/dL (ref 8.9–10.3)
Chloride: 104 mmol/L (ref 98–111)
Creatinine, Ser: 0.65 mg/dL (ref 0.44–1.00)
GFR, Estimated: >60 mL/min (ref >60)
Glucose, Bld: 136 mg/dL — ABNORMAL HIGH (ref 70–99)
Potassium: 3.3 mmol/L — ABNORMAL LOW (ref 3.5–5.1)
Sodium: 139 mmol/L (ref 135–145)

## 2021-04-20 LAB — CBC
HCT: 40.9 % (ref 36.0–46.0)
Hemoglobin: 14.2 g/dL (ref 12.0–15.0)
MCH: 27.7 pg (ref 26.0–34.0)
MCHC: 34.7 g/dL (ref 30.0–36.0)
MCV: 79.7 fL — ABNORMAL LOW (ref 80.0–100.0)
Platelets: 226 10*3/uL (ref 150–400)
RBC: 5.13 MIL/uL — ABNORMAL HIGH (ref 3.87–5.11)
RDW: 12.6 % (ref 11.5–15.5)
WBC: 7.7 10*3/uL (ref 4.0–10.5)
nRBC: 0 % (ref 0.0–0.2)

## 2021-04-20 LAB — OCCULT BLOOD X 1 CARD TO LAB, STOOL: Fecal Occult Bld: POSITIVE — AB

## 2021-04-20 NOTE — ED Provider Notes (Signed)
[facility] Provider Note   CSN: [id number] Arrival date & time: 04/20/21  1936     History Chief Complaint  Patient presents with   Rectal Bleeding    Linda Baldwin is a 50 y.o. female.  Patient with COVID-19, on day 5 of illness, on Paxlovid presents to the emergency department for diarrhea and bright red blood in stool.  No abdominal pain or vomiting.  No lightheadedness, syncope, shortness of breath or chest pain.  Patient is not anticoagulated.  She has never had blood in her stool in the past.  She is due for screening colonoscopy, and has not yet had one.  Today is her third day of Paxlovid.  The onset of this condition was acute. The course is constant. Aggravating factors: none. Alleviating factors: none.  Reports history of hemorrhoids, no active symptoms, this is when she was pregnant.       Past Medical History:  Diagnosis Date   Anemia    Heart murmur    Dx in early teens, never has caused any problems   History of blood transfusion 01/2018   at [facility]   SVD (spontaneous vaginal delivery)    x 2   Termination of pregnancy (fetus)    at a clinic    Patient Active Problem List   Diagnosis Date Noted   Leiomyoma of uterus 04/27/2018   Hypokalemia 01/17/2018   Iron deficiency anemia due to chronic blood loss 01/17/2018   Fever 01/17/2018   Symptomatic anemia 01/16/2018    Past Surgical History:  Procedure Laterality Date   ABDOMINAL HYSTERECTOMY N/A 04/27/2018   Procedure: HYSTERECTOMY ABDOMINAL;  Surgeon: Jerelyn Charles, MD;  Location: [facility];  Service: Gynecology;  Laterality: N/A;   BILATERAL SALPINGECTOMY  04/27/2018   Procedure: BILATERAL SALPINGECTOMY;  Surgeon: Jerelyn Charles, MD;  Location: [facility];  Service: Gynecology;;   ESOPHAGOGASTRODUODENOSCOPY N/A 01/17/2018   Procedure: ESOPHAGOGASTRODUODENOSCOPY (EGD);  Surgeon: Laurence Spates, MD;  Location: [facility];  Service: Endoscopy;  Laterality: N/A;   TUBAL LIGATION      WISDOM TOOTH EXTRACTION       OB History   No obstetric history on file.     No family history on file.  Social History   Tobacco Use   Smoking status: Never   Smokeless tobacco: Never  Vaping Use   Vaping Use: Never used  Substance Use Topics   Alcohol use: Yes    Comment: occassional   Drug use: Yes    Types: Marijuana    Home Medications Prior to Admission medications   Medication Sig Start Date End Date Taking? Authorizing Provider  docusate sodium (COLACE) 100 MG capsule Take 1 capsule (100 mg total) by mouth 2 (two) times daily. Patient taking differently: Take 100 mg by mouth daily.  01/19/18   Bonnielee Haff, MD  famotidine (PEPCID) 20 MG tablet Take 1 tablet (20 mg total) by mouth 2 (two) times daily. Patient taking differently: Take 20 mg by mouth 2 (two) times daily as needed for heartburn.  01/19/18   Bonnielee Haff, MD  ferrous sulfate 325 (65 FE) MG EC tablet Take 1 tablet (325 mg total) by mouth daily with breakfast. 04/29/18   Jerelyn Charles, MD  ibuprofen (ADVIL,MOTRIN) 600 MG tablet Take 1 tablet (600 mg total) by mouth every 6 (six) hours. 04/29/18   Jerelyn Charles, MD    Allergies    Patient has no known allergies.  Review of Systems   Review of Systems  Constitutional:  Negative for fever.  HENT:  Negative for rhinorrhea and sore throat.   Eyes:  Negative for redness.  Respiratory:  Negative for cough.   Cardiovascular:  Negative for chest pain.  Gastrointestinal:  Positive for blood in stool and diarrhea. Negative for abdominal pain, nausea and vomiting.  Genitourinary:  Negative for dysuria, frequency, hematuria and urgency.  Musculoskeletal:  Negative for myalgias.  Skin:  Negative for rash.  Neurological:  Negative for headaches.   Physical Exam Updated Vital Signs BP 133/88   Pulse 93   Temp 98.8 F (37.1 C) (Oral)   Resp 18   Ht 5' (1.524 m)   Wt 72.6 kg   SpO2 98%   BMI 31.25 kg/m   Physical Exam Vitals and nursing note reviewed.  Exam conducted with a chaperone present (NT).  Constitutional:      General: She is not in acute distress.    Appearance: She is well-developed.  HENT:     Head: Normocephalic and atraumatic.     Right Ear: External ear normal.     Left Ear: External ear normal.     Nose: Nose normal.  Eyes:     Conjunctiva/sclera: Conjunctivae normal.  Cardiovascular:     Rate and Rhythm: Normal rate and regular rhythm.     Heart sounds: No murmur heard. Pulmonary:     Effort: No respiratory distress.     Breath sounds: No wheezing, rhonchi or rales.  Abdominal:     Palpations: Abdomen is soft.     Tenderness: There is no abdominal tenderness. There is no guarding or rebound.  Genitourinary:    Exam position: Knee-chest position.     Rectum: Guaiac result positive. External hemorrhoid (circumferentially, no obvious bleeding) present. No tenderness.  Musculoskeletal:     Cervical back: Normal range of motion and neck supple.     Right lower leg: No edema.     Left lower leg: No edema.  Skin:    General: Skin is warm and dry.     Findings: No rash.  Neurological:     General: No focal deficit present.     Mental Status: She is alert. Mental status is at baseline.     Motor: No weakness.  Psychiatric:        Mood and Affect: Mood normal.    ED Results / Procedures / Treatments   Labs (all labs ordered are listed, but only abnormal results are displayed) Labs Reviewed  CBC - Abnormal; Notable for the following components:      Result Value   RBC 5.13 (*)    MCV 79.7 (*)    All other components within normal limits  BASIC METABOLIC PANEL - Abnormal; Notable for the following components:   Potassium 3.3 (*)    Glucose, Bld 136 (*)    All other components within normal limits  OCCULT BLOOD X 1 CARD TO LAB, STOOL - Abnormal; Notable for the following components:   Fecal Occult Bld POSITIVE (*)    All other components within normal limits    EKG None  Radiology No results  found.  Procedures Procedures   Medications Ordered in ED Medications - No data to display  ED Course  I have reviewed the triage vital signs and the nursing notes.  Pertinent labs & imaging results that were available during my care of the patient were reviewed by me and considered in my medical decision making (see chart for details).  Patient seen and examined.  Work-up initiated.   Vital signs reviewed and are as follows: BP 133/88   Pulse 93   Temp 98.8 F (37.1 C) (Oral)   Resp 18   Ht 5' (1.524 m)   Wt 72.6 kg   SpO2 98%   BMI 31.25 kg/m   Rectal exam performed with NP chaperone.  Patient tolerated well.  No obvious bleeding from hemorrhoids, but they are present.  9:48 PM lab work is reassuring with normal hemoglobin.  Patient is hemodynamically stable.  Symptoms are likely related to ongoing diarrhea.  We will have patient monitor closely at home, return with worsening amounts of blood.  Otherwise follow-up with PCP for consideration of colonoscopy for which she is due.  The patient was urged to return to the Emergency Department immediately with worsening of current symptoms, worsening abdominal pain, persistent vomiting, fever, or any other concerns. The patient verbalized understanding.      MDM Rules/Calculators/A&P                          Patient with active COVID-19 infection with diarrhea in setting of antiviral use.  Patient with small amount of red blood, bright red, noted in stool.  No abdominal pain.  Hemorrhoids noted on exam, none of which are obviously thrombosed or bleeding.  Symptoms mild.  Hemoglobin normal.  Patient is hemodynamically stable.  No anticoagulation.  Close monitoring and outpatient follow-up indicated at this time.  Do not feel that she requires admission to the hospital.  She looks very well.  Final Clinical Impression(s) / ED Diagnoses Final diagnoses:  Rectal bleeding  COVID-19    Rx / DC Orders ED Discharge Orders     None         Carlisle Cater, Hershal Coria 04/20/21 2150    Drenda Freeze, MD 04/21/21 1447

## 2021-04-20 NOTE — ED Triage Notes (Signed)
Pt reports bright red blood w/ BM x 1 just PTA

## 2021-04-20 NOTE — ED Notes (Signed)
Had a BM with blood in it just a few minutes ago

## 2021-04-20 NOTE — Discharge Instructions (Signed)
Please read and follow all provided instructions.  Your diagnoses today include:  1. Rectal bleeding   2. COVID-19     Tests performed today include: Blood cell counts (white, red, and platelets) Electrolytes  Kidney function test Stool test for blood - was positive Vital signs. See below for your results today.   Medications prescribed:  None  Take any prescribed medications only as directed.  Home care instructions:  Follow any educational materials contained in this packet.  BE VERY CAREFUL not to take multiple medicines containing Tylenol (also called acetaminophen). Doing so can lead to an overdose which can damage your liver and cause liver failure and possibly death.   Follow-up instructions: Please follow-up with your primary care provider in the next 3 days for further evaluation of your symptoms.   Return instructions:  Please return to the Emergency Department if you experience worsening symptoms.  Please return if you have any other emergent concerns.  Additional Information:  Your vital signs today were: BP 119/87   Pulse 77   Temp 98.8 F (37.1 C) (Oral)   Resp 18   Ht 5' (1.524 m)   Wt 72.6 kg   SpO2 94%   BMI 31.25 kg/m  If your blood pressure (BP) was elevated above 135/85 this visit, please have this repeated by your doctor within one month. --------------

## 2023-11-28 ENCOUNTER — Other Ambulatory Visit: Payer: Self-pay

## 2023-11-28 ENCOUNTER — Encounter (HOSPITAL_BASED_OUTPATIENT_CLINIC_OR_DEPARTMENT_OTHER): Payer: Self-pay

## 2023-11-28 ENCOUNTER — Emergency Department (HOSPITAL_BASED_OUTPATIENT_CLINIC_OR_DEPARTMENT_OTHER): Payer: Commercial Managed Care - HMO

## 2023-11-28 ENCOUNTER — Emergency Department (HOSPITAL_BASED_OUTPATIENT_CLINIC_OR_DEPARTMENT_OTHER)
Admission: EM | Admit: 2023-11-28 | Discharge: 2023-11-28 | Disposition: A | Discharge status: Home / Self Care | Payer: Commercial Managed Care - HMO | Attending: Emergency Medicine | Admitting: Emergency Medicine

## 2023-11-28 DIAGNOSIS — M25562 Pain in left knee: Secondary | ICD-10-CM | POA: Insufficient documentation

## 2023-11-28 MED ORDER — MELOXICAM 7.5 MG PO TABS: 7.5000 mg | ORAL_TABLET | Freq: Every day | ORAL | 0 refills | Status: AC

## 2023-11-28 NOTE — Discharge Instructions (Addendum)
 You have been seen today for your complaint of left knee pain. Your imaging showed some swelling but was otherwise reassuring. Your discharge medications include meloxicam .  This is an anti-inflammatory pain medicine.  Take this once daily.  You may take Tylenol  with this medicine.  You may take up to 1000 mg of Tylenol  every 6 hours. Home care instructions are as follows:  Continue icing the knee and elevating the knee above the level of the heart when resting.  Wear the Ace wrap as often as possible. Follow up with: Dr. Barton.  He is an investment banker, operational.  Call to schedule follow-up appointment. Please seek immediate medical care if you develop any of the following symptoms: You have a fever or chills. You have symptoms that do not get better with treatment. You have pain or swelling that: Gets worse. Goes away and then comes back. You have pus coming from the affected area. You have redness around the affected area. The affected area is warm to the touch. At this time there does not appear to be the presence of an emergent medical condition, however there is always the potential for conditions to change. Please read and follow the below instructions.  Do not take your medicine if  develop an itchy rash, swelling in your mouth or lips, or difficulty breathing; call 911 and seek immediate emergency medical attention if this occurs.  You may review your lab tests and imaging results in their entirety on your MyChart account.  Please discuss all results of fully with your primary care provider and other specialist at your follow-up visit.  Note: Portions of this text may have been transcribed using voice recognition software. Every effort was made to ensure accuracy; however, inadvertent computerized transcription errors may still be present.

## 2023-11-28 NOTE — ED Notes (Signed)
 Pt has L knee tenderness. 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 Pt has full ROM but pain with movement.   Placed into gown. Tenderness para-patellar on both sides of L knee. Pt also has associated swelling with pain down L shin and ankles.

## 2023-11-28 NOTE — ED Provider Notes (Signed)
 [place] [facility] Provider Note   CSN: [id number] Arrival date & time: 11/28/23  1441     History  Chief Complaint  Patient presents with   Knee Pain    Linda Baldwin is a 53 y.o. female.  With history of anemia presenting to the ED for evaluation of left knee pain.  She noticed this pain approximately 2 weeks ago.  States prior to symptom onset she was on her hands and knees cleaning a bathtub.  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  She reports a full sensation in her knee and a feeling as though the knee needs to pop.  She denies any fevers or chills.  No rashes.  No calf or ankle pain.  No hip pain.  No nausea or vomiting.  No other history of trauma.   Knee Pain      Home Medications Prior to Admission medications   Medication Sig Start Date End Date Taking? Authorizing Provider  meloxicam  (MOBIC ) 7.5 MG tablet Take 1 tablet (7.5 mg total) by mouth daily. 11/28/23  Yes Delaine Canter, Marsa CHRISTELLA, PA-C  docusate sodium  (COLACE) 100 MG capsule Take 1 capsule (100 mg total) by mouth 2 (two) times daily. Patient taking differently: Take 100 mg by mouth daily.  01/19/18   Krishnan, Gokul, MD  famotidine  (PEPCID ) 20 MG tablet Take 1 tablet (20 mg total) by mouth 2 (two) times daily. Patient taking differently: Take 20 mg by mouth 2 (two) times daily as needed for heartburn.  01/19/18   Krishnan, Gokul, MD  ferrous sulfate  325 (65 FE) MG EC tablet Take 1 tablet (325 mg total) by mouth daily with breakfast. 04/29/18   Gretta Gums, MD  ibuprofen  (ADVIL ,MOTRIN ) 600 MG tablet Take 1 tablet (600 mg total) by mouth every 6 (six) hours. 04/29/18   Gretta Gums, MD      Allergies    Patient has no known allergies.    Review of Systems   Review of Systems  Musculoskeletal:  Positive for arthralgias.  All other systems reviewed and are negative.   Physical Exam Updated Vital Signs BP (!) 128/92 (BP Location: Right Arm)   Pulse 84    Temp 97.8 F (36.6 C)   Resp 18   Ht 5' (1.524 m)   Wt 77.1 kg   SpO2 97%   BMI 33.20 kg/m  Physical Exam Vitals and nursing note reviewed.  Constitutional:      General: She is not in acute distress.    Appearance: Normal appearance. She is normal weight. She is not ill-appearing.  HENT:     Head: Normocephalic and atraumatic.  Pulmonary:     Effort: Pulmonary effort is normal. No respiratory distress.  Abdominal:     General: Abdomen is flat.  Musculoskeletal:        General: Normal range of motion.     Cervical back: Neck supple.     Comments: Full AROM of bilateral knees.  No overlying erythema of the left knee.  No warmth.  No tenderness to palpation.  Mild suprapatellar swelling.  DP pulses 2+ bilaterally.  Sensation intact distally.  Compartments are soft.  Capillary refill normal.  Skin:    General: Skin is warm and dry.  Neurological:     Mental Status: She is alert and oriented to person, place, and time.  Psychiatric:        Mood and Affect: Mood normal.  Behavior: Behavior normal.     ED Results / Procedures / Treatments   Labs (all labs ordered are listed, but only abnormal results are displayed) Labs Reviewed - No data to display  EKG None  Radiology DG Knee Complete 4 Views Left Result Date: 11/28/2023 CLINICAL DATA:  Pain EXAM: LEFT KNEE - COMPLETE 4+ VIEW COMPARISON:  None Available. FINDINGS: No evidence of fracture or dislocation. Large knee joint effusion. No evidence of arthropathy or other focal bone abnormality. Soft tissues are unremarkable. IMPRESSION: No fracture or dislocation of the left knee. Large knee joint effusion. Electronically Signed   By: Marolyn JONETTA Jaksch M.D.   On: 11/28/2023 15:36    Procedures Procedures    Medications Ordered in ED Medications - No data to display  ED Course/ Medical Decision Making/ A&P                                 Medical Decision Making Amount and/or Complexity of Data Reviewed Radiology:  ordered.  This patient presents to the ED for concern of left knee pain, this involves an extensive number of treatment options, and is a complaint that carries with it a high risk of complications and morbidity.  The differential diagnosis includes OA, RA, septic arthritis, bursitis, fracture, strain, sprain  My initial workup includes imaging  Additional history obtained from: Nursing notes from this visit.  I ordered imaging studies including x-ray left knee I independently visualized and interpreted imaging which showed large joint effusion.  No acute osseous abnormality. I agree with the radiologist interpretation  Afebrile, hemodynamically stable.  53 year old female presenting to the ED for evaluation of left knee pain.  This been present for the past 2 weeks.  Feels like more of a fullness sensation.  She was told to come to the emergency department by family members.  She appears well on physical exam.  There is some mild suprapatellar swelling to the left knee.  No warmth, erythema, exquisite tenderness to palpation to suggest septic arthritis.  No signs or symptoms of systemic infection.  Suspect bursitis.  She was given an Ace wrap in the emergency department.  She was given contact information for orthopedics and encouraged to follow-up.  Prescription for meloxicam  was sent.  She was educated to ice and elevate the knee.  She was given return precautions including signs and symptoms of infection.  Stable at discharge.  At this time there does not appear to be any evidence of an acute emergency medical condition and the patient appears stable for discharge with appropriate outpatient follow up. Diagnosis was discussed with patient who verbalizes understanding of care plan and is agreeable to discharge. I have discussed return precautions with patient who verbalizes understanding. Patient encouraged to follow-up with their PCP within 1 week. All questions answered.  Note: Portions of this  report may have been transcribed using voice recognition software. Every effort was made to ensure accuracy; however, inadvertent computerized transcription errors may still be present.        Final Clinical Impression(s) / ED Diagnoses Final diagnoses:  Acute pain of left knee    Rx / DC Orders ED Discharge Orders          Ordered    meloxicam  (MOBIC ) 7.5 MG tablet  Daily        11/28/23 1819              Edwardo Marsa HERO, DEVONNA 11/28/23  1821    Yolande Lamar BROCKS, MD 11/29/23 [phone]

## 2023-11-28 NOTE — ED Triage Notes (Signed)
 Pt reports that she is having some left knee issues. States that knee is sore and that it feels like its wanting to pop out of socket. States that it is swollen and it is affecting her balance.

## 2024-10-10 ENCOUNTER — Ambulatory Visit

## 2024-10-10 ENCOUNTER — Other Ambulatory Visit: Payer: Self-pay

## 2024-10-10 DIAGNOSIS — M25562 Pain in left knee: Secondary | ICD-10-CM | POA: Insufficient documentation

## 2024-10-10 DIAGNOSIS — R262 Difficulty in walking, not elsewhere classified: Secondary | ICD-10-CM | POA: Diagnosis present

## 2024-10-10 NOTE — Therapy (Signed)
 " OUTPATIENT PHYSICAL THERAPY LOWER EXTREMITY EVALUATION   Patient Name: Linda Baldwin MRN: [id number] DOB:07-17-1971, 53 y.o., female Today's Date: 10/10/2024  END OF SESSION:  PT End of Session - 10/10/24 1431     Visit Number 1    Date for Recertification  11/07/24    PT Start Time 1315    PT Stop Time 1400    PT Time Calculation (min) 45 min    Activity Tolerance Patient tolerated treatment well          Past Medical History:  Diagnosis Date   Anemia    Heart murmur    Dx in early teens, never has caused any problems   History of blood transfusion 01/2018   at [facility]   SVD (spontaneous vaginal delivery)    x 2   Termination of pregnancy (fetus)    at a clinic   Past Surgical History:  Procedure Laterality Date   ABDOMINAL HYSTERECTOMY N/A 04/27/2018   Procedure: HYSTERECTOMY ABDOMINAL;  Surgeon: Gretta Gums, MD;  Location: [facility];  Service: Gynecology;  Laterality: N/A;   BILATERAL SALPINGECTOMY  04/27/2018   Procedure: BILATERAL SALPINGECTOMY;  Surgeon: Gretta Gums, MD;  Location: [facility];  Service: Gynecology;;   ESOPHAGOGASTRODUODENOSCOPY N/A 01/17/2018   Procedure: ESOPHAGOGASTRODUODENOSCOPY (EGD);  Surgeon: Celestia Agent, MD;  Location: [facility];  Service: Endoscopy;  Laterality: N/A;   TUBAL LIGATION     WISDOM TOOTH EXTRACTION     Patient Active Problem List   Diagnosis Date Noted   Leiomyoma of uterus 04/27/2018   Hypokalemia 01/17/2018   Iron  deficiency anemia due to chronic blood loss 01/17/2018   Fever 01/17/2018   Symptomatic anemia 01/16/2018    PCP: no PCP  REFERRING PROVIDER: Rubie Kemps, MD  REFERRING DIAG: s/p arthroscopic surgery for L medial meniscus tear  THERAPY DIAG:  Acute pain of left knee  Difficulty in walking, not elsewhere classified  Rationale for Evaluation and Treatment: Rehabilitation  ONSET DATE: 10/04/24  SUBJECTIVE:   SUBJECTIVE STATEMENT: I am really sore up in my L  thigh, I'm so glad to see the swelling  gone in my L leg.  Feeling pretty good   PERTINENT HISTORY: Underwent medial meniscus repair , had progressive pain and edema L knee prior to surgery PAIN:  Are you having pain? Yes: NPRS scale: 0 to 2 Pain location: L ant/med knee Pain description: deep tightness pain Aggravating factors: squatting , sit to stand Relieving factors: meds, rest  PRECAUTIONS: None  RED FLAGS: None   WEIGHT BEARING RESTRICTIONS: No  FALLS:  Has patient fallen in last 6 months? No  LIVING ENVIRONMENT: Lives with: lives with their family Lives in: House/apartment Stairs: No Has following equipment at home: None  OCCUPATION: works as architectural technologist, also herbalist   PLOF: Independent  PATIENT GOALS: get back to work able to climb flight of steps and bus steps without pain or giving away   NEXT MD VISIT: this afternoon 10/10/24  OBJECTIVE:  Note: Objective measures were completed at Evaluation unless otherwise noted.  DIAGNOSTIC FINDINGS: na  PATIENT SURVEYS:  Tegner Lysholm Knee Score: 80 / 100 = 80.0 %  COGNITION: Overall cognitive status: Within functional limits for tasks assessed     SENSATION: WFL  EDEMA:  Very mild L ant distal thigh  MUSCLE LENGTH: Hamstrings: Right 15 deg; Left 15 deg   POSTURE: No Significant postural limitations  PALPATION: Normal patellar mobility, tender over portals L ant/lat knee  LOWER EXTREMITY ROM:  Active ROM Right eval Left eval  Hip flexion    Hip extension    Hip abduction    Hip adduction    Hip internal rotation    Hip external rotation    Knee flexion 131 116  Knee extension 3 hyper -4  Ankle dorsiflexion    Ankle plantarflexion    Ankle inversion    Ankle eversion     (Blank rows = not tested)  LOWER EXTREMITY MMT:  MMT Right eval Left eval  Hip flexion    Hip extension    Hip abduction    Hip adduction    Hip internal rotation    Hip external rotation    Knee flexion 5 5  Knee  extension 5 5  Ankle dorsiflexion heel walk wnl wnl  Ankle plantarflexion toe walk  wnl wnl  Ankle inversion    Ankle eversion     (Blank rows = not tested)   FUNCTIONAL TESTS:  30 sec sit to stand 14 reps Forward step up L LE, wnl for 6 step Forward R heel tap off 2 and 4  step, pain L ant knee  noted GAIT: Distance walked: in clinic 47' Assistive device utilized: None Level of assistance: Complete Independence                                                                                                                              TREATMENT DATE: 10/10/24: Evaluation, instructed in stretching ex for knee flex and ext and in B calf stretch off low step.  Advised regarding expectations for normal healing process, to avoid excessive squatting, stair climbing with pressure on L knee for about 6 weeks to allow for tissue to heal.  Her employer has obtained a bicycle and  elliptical machine and she intends to begun using this equipment when she returns to school on 10/20/24   PATIENT EDUCATION:  Education details: POC< goals Person educated: Patient Education method: Explanation Education comprehension: verbalized understanding  HOME EXERCISE PROGRAM: Access Code: [email or web address] URL: [email or web address] Date: 10/10/2024 Prepared by: Greig Credit  Exercises - Standing Bilateral Gastroc Stretch with Step  - 2 x daily - 7 x weekly - 3 sets - 10 reps - Seated Active Straight-Leg Raise With Hip Abduction  - 2 x daily - 7 x weekly - 3 sets - 10 reps - Seated Knee Flexion Extension AROM   - 2 x daily - 7 x weekly - 3 sets - 10 reps  ASSESSMENT:  CLINICAL IMPRESSION: Patient is a 53 y.o. female who was evaluated today by physical therapy for s/p L medial menisectomy after complex tear.  She is one week post op.  She is demonstrating an excellent recovery, mild limitations in L knee Rom for extension and flexion.  MMT wnl . Some pain as expected with eccentric lowering from a  step with force on L LE.  She is very active, works 3 jobs, she is on break from  her primary job until Jan 5.  Gait is without deficits.  Showed her initial exercises today. Her cost to attend PT sessions is quite prohibitive so as she is so far ahead of schedule with her recovery, advised her to discuss with Dr. Dorian and we will leave her chart open if further PT needs arise, butno further visits scheduled after today. OBJECTIVE IMPAIRMENTS: decreased knowledge of condition, difficulty walking, decreased ROM, decreased strength, increased edema, and pain.   ACTIVITY LIMITATIONS: carrying, lifting, bending, squatting, stairs, and locomotion level  PARTICIPATION LIMITATIONS: cleaning, laundry, community activity, and occupation  PERSONAL FACTORS: Behavior pattern, Education, Fitness, and 1 comorbidity: anemia are also affecting patient's functional outcome.  Good REHAB POTENTIAL:   CLINICAL DECISION MAKING: Stable/uncomplicated  EVALUATION COMPLEXITY: Low   GOALS: Goals reviewed with patient? Yes  SHORT TERM GOALS/ LONG TERM GOALS TOGETHER: Target date: 4 weeks  I HEP Baseline: Goal status: INITIAL  2.  Normal and full ROM L knee  Baseline: see above chart Goal status: INITIAL  3.  30 sec sit to stand improve to 17 o r greater Baseline:  Goal status: INITIAL  4.  Pain free L knee with  ascending, descending 4 steps  Baseline:  Goal status: INITIAL    PLAN:  PT FREQUENCY: pt to call if further appts needed  PT DURATION: 4 weeks  PLANNED INTERVENTIONS: 97110-Therapeutic exercises, 97530- Therapeutic activity, 97112- Neuromuscular re-education, 97535- Self Care, [id number]- Manual therapy, 02967- Electrical stimulation (manual), and Patient/Family education  PLAN FOR NEXT SESSION: will  reassess strength and function L LE , ROM, progress home program as needed   Hyacinth Marcelli L Montez Stryker, PT, DPT, OCS 10/10/2024, 2:36 PM  "
